# Patient Record
Sex: Male | Born: 1955
Health system: Southern US, Community
[De-identification: ages and names within clinical notes are randomized; demographics above are authoritative.]

## PROBLEM LIST (undated history)

## (undated) ENCOUNTER — Ambulatory Visit: Discharge status: Absent without leave | Payer: Self-pay

## (undated) DIAGNOSIS — B019 Varicella without complication: Secondary | ICD-10-CM

## (undated) DIAGNOSIS — I509 Heart failure, unspecified: Secondary | ICD-10-CM

## (undated) DIAGNOSIS — I2510 Atherosclerotic heart disease of native coronary artery without angina pectoris: Secondary | ICD-10-CM

## (undated) DIAGNOSIS — M1990 Unspecified osteoarthritis, unspecified site: Secondary | ICD-10-CM

## (undated) DIAGNOSIS — L80 Vitiligo: Secondary | ICD-10-CM

## (undated) DIAGNOSIS — I429 Cardiomyopathy, unspecified: Secondary | ICD-10-CM

## (undated) DIAGNOSIS — E785 Hyperlipidemia, unspecified: Secondary | ICD-10-CM

## (undated) DIAGNOSIS — I10 Essential (primary) hypertension: Secondary | ICD-10-CM

## (undated) HISTORY — DX: Heart failure, unspecified: I50.9

## (undated) HISTORY — DX: Hyperlipidemia, unspecified: E78.5

## (undated) HISTORY — PX: NO PAST SURGERIES: SHX2092

## (undated) HISTORY — DX: Varicella without complication: B01.9

## (undated) HISTORY — DX: Atherosclerotic heart disease of native coronary artery without angina pectoris: I25.10

## (undated) HISTORY — PX: POLYPECTOMY: SHX149

## (undated) HISTORY — DX: Cardiomyopathy, unspecified: I42.9

## (undated) HISTORY — DX: Essential (primary) hypertension: I10

## (undated) HISTORY — DX: Vitiligo: L80

---

## 2011-04-09 LAB — PSA: PSA: 0.8

## 2012-01-11 HISTORY — PX: COLONOSCOPY: SHX174

## 2014-12-11 LAB — HEPATIC FUNCTION PANEL
ALT: 19 U/L (ref 10–40)
AST: 19 U/L (ref 14–40)
Alkaline Phosphatase: 90 U/L (ref 25–125)
BILIRUBIN, TOTAL: 1 mg/dL

## 2014-12-11 LAB — BASIC METABOLIC PANEL
BUN: 10 mg/dL (ref 4–21)
Creatinine: 0.9 mg/dL (ref 0.6–1.3)
GLUCOSE: 92 mg/dL
POTASSIUM: 4.5 mmol/L (ref 3.4–5.3)
SODIUM: 137 mmol/L (ref 137–147)

## 2015-08-17 ENCOUNTER — Encounter: Payer: Self-pay | Admitting: Family Medicine

## 2015-08-17 ENCOUNTER — Ambulatory Visit (INDEPENDENT_AMBULATORY_CARE_PROVIDER_SITE_OTHER): Payer: BLUE CROSS/BLUE SHIELD | Admitting: Family Medicine

## 2015-08-17 DIAGNOSIS — Z0001 Encounter for general adult medical examination with abnormal findings: Secondary | ICD-10-CM | POA: Diagnosis not present

## 2015-08-17 DIAGNOSIS — R350 Frequency of micturition: Secondary | ICD-10-CM | POA: Diagnosis not present

## 2015-08-17 DIAGNOSIS — Z130 Encounter for screening for diseases of the blood and blood-forming organs and certain disorders involving the immune mechanism: Secondary | ICD-10-CM

## 2015-08-17 DIAGNOSIS — Z13220 Encounter for screening for lipoid disorders: Secondary | ICD-10-CM

## 2015-08-17 DIAGNOSIS — I10 Essential (primary) hypertension: Secondary | ICD-10-CM | POA: Diagnosis not present

## 2015-08-17 DIAGNOSIS — Z125 Encounter for screening for malignant neoplasm of prostate: Secondary | ICD-10-CM | POA: Diagnosis not present

## 2015-08-17 LAB — LIPID PANEL
CHOL/HDL RATIO: 4
Cholesterol: 208 mg/dL — ABNORMAL HIGH (ref 0–200)
HDL: 56.4 mg/dL (ref >39.00)
LDL CALC: 137 mg/dL — AB (ref 0–99)
NONHDL: 151.82
Triglycerides: 75 mg/dL (ref 0.0–149.0)
VLDL: 15 mg/dL (ref 0.0–40.0)

## 2015-08-17 LAB — COMPREHENSIVE METABOLIC PANEL
ALT: 11 U/L (ref 0–53)
AST: 12 U/L (ref 0–37)
Albumin: 4.4 g/dL (ref 3.5–5.2)
Alkaline Phosphatase: 89 U/L (ref 39–117)
BILIRUBIN TOTAL: 1 mg/dL (ref 0.2–1.2)
BUN: 14 mg/dL (ref 6–23)
CALCIUM: 9.7 mg/dL (ref 8.4–10.5)
CHLORIDE: 103 meq/L (ref 96–112)
CO2: 30 meq/L (ref 19–32)
CREATININE: 0.91 mg/dL (ref 0.40–1.50)
GFR: 109.69 mL/min (ref >60.00)
GLUCOSE: 114 mg/dL — AB (ref 70–99)
Potassium: 4.2 mEq/L (ref 3.5–5.1)
SODIUM: 139 meq/L (ref 135–145)
Total Protein: 7.5 g/dL (ref 6.0–8.3)

## 2015-08-17 LAB — POCT URINALYSIS DIPSTICK
BILIRUBIN UA: NEGATIVE
Glucose, UA: NEGATIVE
KETONES UA: NEGATIVE
LEUKOCYTES UA: NEGATIVE
Nitrite, UA: NEGATIVE
PH UA: 6
PROTEIN UA: NEGATIVE
SPEC GRAV UA: 1.01
Urobilinogen, UA: 0.2

## 2015-08-17 LAB — CBC
HCT: 43.5 % (ref 39.0–52.0)
Hemoglobin: 14.3 g/dL (ref 13.0–17.0)
MCHC: 32.9 g/dL (ref 30.0–36.0)
MCV: 91.4 fl (ref 78.0–100.0)
PLATELETS: 229 10*3/uL (ref 150.0–400.0)
RBC: 4.76 Mil/uL (ref 4.22–5.81)
RDW: 13.9 % (ref 11.5–15.5)
WBC: 4.6 10*3/uL (ref 4.0–10.5)

## 2015-08-17 LAB — PSA: PSA: 1.25 ng/mL (ref 0.10–4.00)

## 2015-08-17 LAB — HEMOGLOBIN A1C: Hgb A1c MFr Bld: 5.6 % (ref 4.6–6.5)

## 2015-08-17 NOTE — Assessment & Plan Note (Signed)
Screening labs today. Colonoscopy up-to-date. Tetanus up-to-date. No indication for zostavax or pneumonia vaccine at this time.

## 2015-08-17 NOTE — Progress Notes (Signed)
Pre visit review using our clinic review tool, if applicable. No additional management support is needed unless otherwise documented below in the visit note. 

## 2015-08-17 NOTE — Patient Instructions (Signed)
Check your BP at home and keep a log.  Follow up BP check in 2 weeks.  We will call with your lab results.  Take care  Dr. Adriana Norman   Health Maintenance, Male A healthy lifestyle and preventative care can promote health and wellness.  Maintain regular health, dental, and eye exams.  Eat a healthy diet. Foods like vegetables, fruits, whole grains, low-fat dairy products, and lean protein foods contain the nutrients you need and are low in calories. Decrease your intake of foods high in solid fats, added sugars, and salt. Get information about a proper diet from your health care provider, if necessary.  Regular physical exercise is one of the most important things you can do for your health. Most adults should get at least 150 minutes of moderate-intensity exercise (any activity that increases your heart rate and causes you to sweat) each week. In addition, most adults need muscle-strengthening exercises on 2 or more days a week.   Maintain a healthy weight. The body mass index (BMI) is a screening tool to identify possible weight problems. It provides an estimate of body fat based on height and weight. Your health care provider can find your BMI and can help you achieve or maintain a healthy weight. For males 20 years and older:  A BMI below 18.5 is considered underweight.  A BMI of 18.5 to 24.9 is normal.  A BMI of 25 to 29.9 is considered overweight.  A BMI of 30 and above is considered obese.  Maintain normal blood lipids and cholesterol by exercising and minimizing your intake of saturated fat. Eat a balanced diet with plenty of fruits and vegetables. Blood tests for lipids and cholesterol should begin at age 3 and be repeated every 5 years. If your lipid or cholesterol levels are high, you are over age 71, or you are at high risk for heart disease, you may need your cholesterol levels checked more frequently.Ongoing high lipid and cholesterol levels should be treated with medicines if  diet and exercise are not working.  If you smoke, find out from your health care provider how to quit. If you do not use tobacco, do not start.  Lung cancer screening is recommended for adults aged 55-80 years who are at high risk for developing lung cancer because of a history of smoking. A yearly low-dose CT scan of the lungs is recommended for people who have at least a 30-pack-year history of smoking and are current smokers or have quit within the past 15 years. A pack year of smoking is smoking an average of 1 pack of cigarettes a day for 1 year (for example, a 30-pack-year history of smoking could mean smoking 1 pack a day for 30 years or 2 packs a day for 15 years). Yearly screening should continue until the smoker has stopped smoking for at least 15 years. Yearly screening should be stopped for people who develop a health problem that would prevent them from having lung cancer treatment.  If you choose to drink alcohol, do not have more than 2 drinks per day. One drink is considered to be 12 oz (360 mL) of beer, 5 oz (150 mL) of wine, or 1.5 oz (45 mL) of liquor.  Avoid the use of street drugs. Do not share needles with anyone. Ask for help if you need support or instructions about stopping the use of drugs.  High blood pressure causes heart disease and increases the risk of stroke. High blood pressure is more likely  to develop in:  People who have blood pressure in the end of the normal range (100-139/85-89 mm Hg).  People who are overweight or obese.  People who are African American.  If you are 61-3 years of age, have your blood pressure checked every 3-5 years. If you are 88 years of age or older, have your blood pressure checked every year. You should have your blood pressure measured twice--once when you are at a hospital or clinic, and once when you are not at a hospital or clinic. Record the average of the two measurements. To check your blood pressure when you are not at a  hospital or clinic, you can use:  An automated blood pressure machine at a pharmacy.  A home blood pressure monitor.  If you are 2-31 years old, ask your health care provider if you should take aspirin to prevent heart disease.  Diabetes screening involves taking a blood sample to check your fasting blood sugar level. This should be done once every 3 years after age 90 if you are at a normal weight and without risk factors for diabetes. Testing should be considered at a younger age or be carried out more frequently if you are overweight and have at least 1 risk factor for diabetes.  Colorectal cancer can be detected and often prevented. Most routine colorectal cancer screening begins at the age of 50 and continues through age 15. However, your health care provider may recommend screening at an earlier age if you have risk factors for colon cancer. On a yearly basis, your health care provider may provide home test kits to check for hidden blood in the stool. A small camera at the end of a tube may be used to directly examine the colon (sigmoidoscopy or colonoscopy) to detect the earliest forms of colorectal cancer. Talk to your health care provider about this at age 32 when routine screening begins. A direct exam of the colon should be repeated every 5-10 years through age 49, unless early forms of precancerous polyps or small growths are found.  People who are at an increased risk for hepatitis B should be screened for this virus. You are considered at high risk for hepatitis B if:  You were born in a country where hepatitis B occurs often. Talk with your health care provider about which countries are considered high risk.  Your parents were born in a high-risk country and you have not received a shot to protect against hepatitis B (hepatitis B vaccine).  You have HIV or AIDS.  You use needles to inject street drugs.  You live with, or have sex with, someone who has hepatitis B.  You are a  man who has sex with other men (MSM).  You get hemodialysis treatment.  You take certain medicines for conditions like cancer, organ transplantation, and autoimmune conditions.  Hepatitis C blood testing is recommended for all people born from [birth year] through 1964 and any individual with known risk factors for hepatitis C.  Healthy men should no longer receive prostate-specific antigen (PSA) blood tests as part of routine cancer screening. Talk to your health care provider about prostate cancer screening.  Testicular cancer screening is not recommended for adolescents or adult males who have no symptoms. Screening includes self-exam, a health care provider exam, and other screening tests. Consult with your health care provider about any symptoms you have or any concerns you have about testicular cancer.  Practice safe sex. Use condoms and avoid high-risk sexual practices to  reduce the spread of sexually transmitted infections (STIs).  You should be screened for STIs, including gonorrhea and chlamydia if:  You are sexually active and are younger than 24 years.  You are older than 24 years, and your health care provider tells you that you are at risk for this type of infection.  Your sexual activity has changed since you were last screened, and you are at an increased risk for chlamydia or gonorrhea. Ask your health care provider if you are at risk.  If you are at risk of being infected with HIV, it is recommended that you take a prescription medicine daily to prevent HIV infection. This is called pre-exposure prophylaxis (PrEP). You are considered at risk if:  You are a man who has sex with other men (MSM).  You are a heterosexual man who is sexually active with multiple partners.  You take drugs by injection.  You are sexually active with a partner who has HIV.  Talk with your health care provider about whether you are at high risk of being infected with HIV. If you choose to begin PrEP,  you should first be tested for HIV. You should then be tested every 3 months for as long as you are taking PrEP.  Use sunscreen. Apply sunscreen liberally and repeatedly throughout the day. You should seek shade when your shadow is shorter than you. Protect yourself by wearing long sleeves, pants, a wide-brimmed hat, and sunglasses year round whenever you are outdoors.  Tell your health care provider of new moles or changes in moles, especially if there is a change in shape or color. Also, tell your health care provider if a mole is larger than the size of a pencil eraser.  A one-time screening for abdominal aortic aneurysm (AAA) and surgical repair of large AAAs by ultrasound is recommended for men aged 65-75 years who are current or former smokers.  Stay current with your vaccines (immunizations).   This information is not intended to replace advice given to you by your health care provider. Make sure you discuss any questions you have with your health care provider.   Document Released: 06/25/2007 Document Revised: 01/17/2014 Document Reviewed: 05/24/2010 Elsevier Interactive Patient Education [employer]

## 2015-08-17 NOTE — Assessment & Plan Note (Addendum)
Uncontrolled. BP elevated today and will repeat. Patient endorses that he does not normally have any issues with his blood pressure. Labs today. Patient to check his blood pressure daily at home. Follow-up in 2 weeks for blood pressure check. Patient is to inform me if his blood pressure continues to stay elevated greater than 140/90 consistently. Continuing Norvasc.

## 2015-08-17 NOTE — Assessment & Plan Note (Signed)
New problem. Likely related to increased water intake particularly close to bedtime. Patient could have an element of BPH. Advised conservative measures at this point: Decreasing caffeine and water intake prior to bed. Follow-up if he fails to improve or worsens.

## 2015-08-17 NOTE — Progress Notes (Signed)
Subjective:  Patient ID: Sean Norman, male    DOB: Sep 30, 1955  Age: 60 y.o. MRN: [id number]  CC: Establish care  HPI Sean Norman is a 60 y.o. male presents to the clinic today to establish care.  Preventative Healthcare  Colonoscopy: Up to date - 06/30/14  Immunizations  Tetanus - 1-2 years ago.  Pneumococcal - N/A given age and lack of co-morbidities/risk factors.  Flu - We do not have the vaccine yet  Zoster - Not indicated per guidelines.  Prostate cancer screening: PSA today.  Hepatitis C screening - Declines.  Labs: Screening labs today.  Exercise: Regular exercise.   Alcohol use: No.  Smoking/tobacco use: No.  STD/HIV testing: Declines.  Wears seat belt: Yes.   Hypertension  Has been stable Per his report.  He's taking amlodipine 5 mg twice daily. He endorses compliance.  His blood pressure is markedly elevated today. See below.   We'll discuss today.  Urinary frequency  Patient reports that he's been urinating frequently for quite some time.  He states that he's getting up 4-5 times a night to urinate.  Patient does state he drinks a lot of water.  He reports a good stream and flow.  No hesitancy. No drinking.  No known relieving factors. No associated dysuria.  He reports intentional weight loss.  No reports of abdominal pain, fever, chills. No other complaints this time.  PMH, Surgical Hx, Family Hx, Social History reviewed and updated as below.  Past Medical History:  Diagnosis Date  . Chicken pox   . Hypertension    Past Surgical History:  Procedure Laterality Date  . NO PAST SURGERIES     Family History  Problem Relation Age of Onset  . Heart disease Mother   . Hypertension Mother   . Kidney disease Mother   . Diabetes Mother   . Heart disease Father   . Stroke Father   . Hypertension Father   . Breast cancer Maternal Grandmother   . Hypertension Maternal Grandmother   . Heart disease Maternal Grandmother   .  Hypertension Maternal Grandfather   . Stroke Maternal Grandfather   . Heart disease Maternal Grandfather   . Diabetes Paternal Grandfather    Social History  Substance Use Topics  . Smoking status: Never Smoker  . Smokeless tobacco: Never Used  . Alcohol use No    Review of Systems  Genitourinary: Positive for frequency.  All other systems reviewed and are negative.  Objective:   Today's Vitals: BP (!) 168/114 (BP Location: Left Arm, Patient Position: Sitting, Cuff Size: Normal)   Pulse 87   Temp 98 F (36.7 C) (Oral)   Ht '6\' 2"'  (1.88 m)   Wt 264 lb 4 oz (119.9 kg)   SpO2 98%   BMI 33.93 kg/m   Physical Exam  Constitutional: He is oriented to person, place, and time. He appears well-developed and well-nourished. No distress.  HENT:  Head: Normocephalic and atraumatic.  Nose: Nose normal.  Mouth/Throat: Oropharynx is clear and moist. No oropharyngeal exudate.  Normal TM's bilaterally.   Eyes: Conjunctivae are normal. No scleral icterus.  Neck: Neck supple. No thyromegaly present.  Cardiovascular: Normal rate and regular rhythm.   No murmur heard. Pulmonary/Chest: Effort normal and breath sounds normal. He has no wheezes. He has no rales.  Abdominal: Soft. He exhibits no distension. There is no tenderness. There is no rebound and no guarding.  Musculoskeletal: Normal range of motion. He exhibits no edema.  Lymphadenopathy:  He has no cervical adenopathy.  Neurological: He is alert and oriented to person, place, and time.  Skin: Skin is warm and dry. No rash noted.  No hair (assuming from alopecia).  Psychiatric: He has a normal mood and affect.  Vitals reviewed.  Assessment & Plan:   Problem List Items Addressed This Visit    Encounter for preventative adult health care exam with abnormal findings    Screening labs today. Colonoscopy up-to-date. Tetanus up-to-date. No indication for zostavax or pneumonia vaccine at this time.      Essential hypertension     Uncontrolled. BP elevated today and will repeat. Patient endorses that he does not normally have any issues with his blood pressure. Labs today. Patient to check his blood pressure daily at home. Follow-up in 2 weeks for blood pressure check. Patient is to inform me if his blood pressure continues to stay elevated greater than 140/90 consistently. Continuing Norvasc.      Relevant Medications   amLODipine (NORVASC) 5 MG tablet   Other Relevant Orders   Comp Met (CMET)   HgB A1c   Urinary frequency    New problem. Likely related to increased water intake particularly close to bedtime. Patient could have an element of BPH. Advised conservative measures at this point: Decreasing caffeine and water intake prior to bed. Follow-up if he fails to improve or worsens.      Relevant Orders   POCT Urinalysis Dipstick (Completed)    Other Visit Diagnoses    Screening for deficiency anemia       Relevant Orders   CBC   Screening for prostate cancer       Relevant Orders   PSA   Screening, lipid       Relevant Orders   Lipid Profile      Outpatient Encounter Prescriptions as of 08/17/2015  Medication Sig  . amLODipine (NORVASC) 5 MG tablet Take 5 mg by mouth 2 (two) times daily.   No facility-administered encounter medications on file as of 08/17/2015.     Follow-up: 2 weeks for BP check.   [place]enzie

## 2015-08-19 ENCOUNTER — Other Ambulatory Visit: Payer: Self-pay | Admitting: Family Medicine

## 2015-08-19 MED ORDER — ATORVASTATIN CALCIUM 40 MG PO TABS: 40.0000 mg | ORAL_TABLET | Freq: Every day | ORAL | 3 refills | Status: DC

## 2015-09-10 ENCOUNTER — Encounter: Payer: Self-pay | Admitting: Family Medicine

## 2015-09-15 ENCOUNTER — Telehealth: Payer: Self-pay | Admitting: *Deleted

## 2015-09-16 ENCOUNTER — Encounter: Payer: Self-pay | Admitting: Family Medicine

## 2015-09-16 ENCOUNTER — Encounter (INDEPENDENT_AMBULATORY_CARE_PROVIDER_SITE_OTHER): Payer: Self-pay

## 2015-09-16 ENCOUNTER — Ambulatory Visit (INDEPENDENT_AMBULATORY_CARE_PROVIDER_SITE_OTHER): Payer: BLUE CROSS/BLUE SHIELD | Admitting: Family Medicine

## 2015-09-16 VITALS — BP 164/107 | HR 86 | Temp 97.8°F | Wt 263.5 lb

## 2015-09-16 DIAGNOSIS — R8290 Unspecified abnormal findings in urine: Secondary | ICD-10-CM | POA: Diagnosis not present

## 2015-09-16 DIAGNOSIS — I10 Essential (primary) hypertension: Secondary | ICD-10-CM

## 2015-09-16 DIAGNOSIS — E785 Hyperlipidemia, unspecified: Secondary | ICD-10-CM | POA: Insufficient documentation

## 2015-09-16 MED ORDER — HYDROCHLOROTHIAZIDE 25 MG PO TABS: 25.0000 mg | ORAL_TABLET | Freq: Every day | ORAL | 3 refills | Status: DC

## 2015-09-16 NOTE — Progress Notes (Signed)
   Subjective:  Patient ID: Sean Norman, male    DOB: 01/07/1956  Age: 60 y.o. MRN: [id number]  CC: Followup HTN  HPI:  60 year old male with hypertension and hyperlipidemia presents for follow-up regarding hypertension.  HTN  BP readings elevated at home.  BP elevated today.  Endorses compliance with Norvasc.  No other associated symptoms.  No other complaints at this time.  Social Hx   Social History   Social History  . Marital status: Married    Spouse name: N/A  . Number of children: N/A  . Years of education: N/A   Social History Main Topics  . Smoking status: Never Smoker  . Smokeless tobacco: Never Used  . Alcohol use No  . Drug use: No  . Sexual activity: Not Asked   Other Topics Concern  . None   Social History Narrative  . None   Review of Systems  Respiratory: Negative.   Cardiovascular: Negative.    Objective:  BP (!) 164/107 (BP Location: Right Arm, Patient Position: Sitting, Cuff Size: Large)   Pulse 86   Temp 97.8 F (36.6 C) (Oral)   Wt 263 lb 8 oz (119.5 kg)   SpO2 96%   BMI 33.83 kg/m   BP/Weight 09/16/2015 08/17/2015  Systolic BP 164 168  Diastolic BP 107 114  Wt. (Lbs) 263.5 264.25  BMI 33.83 33.93   Physical Exam  Constitutional: He is oriented to person, place, and time. He appears well-developed. No distress.  Cardiovascular: Normal rate and regular rhythm.   Pulmonary/Chest: Effort normal. He has no wheezes. He has no rales.  Neurological: He is alert and oriented to person, place, and time.  Psychiatric: He has a normal mood and affect.  Vitals reviewed.  Lab Results  Component Value Date   WBC 4.6 08/17/2015   HGB 14.3 08/17/2015   HCT 43.5 08/17/2015   PLT 229.0 08/17/2015   GLUCOSE 114 (H) 08/17/2015   CHOL 208 (H) 08/17/2015   TRIG 75.0 08/17/2015   HDL 56.40 08/17/2015   LDLCALC 137 (H) 08/17/2015   ALT 11 08/17/2015   AST 12 08/17/2015   NA 139 08/17/2015   K 4.2 08/17/2015   CL 103 08/17/2015   CREATININE 0.91 08/17/2015   BUN 14 08/17/2015   CO2 30 08/17/2015   PSA 1.25 08/17/2015   HGBA1C 5.6 08/17/2015    Assessment & Plan:   Problem List Items Addressed This Visit    Essential hypertension - Primary    Established problem, worsening. Continue Lisinopril. Adding HCTZ.       Relevant Medications   hydrochlorothiazide (HYDRODIURIL) 25 MG tablet    Other Visit Diagnoses    Abnormal urinalysis       Relevant Orders   Urine Microscopic Only     Meds ordered this encounter  Medications  . hydrochlorothiazide (HYDRODIURIL) 25 MG tablet    Sig: Take 1 tablet (25 mg total) by mouth daily.    Dispense:  90 tablet    Refill:  3    Follow-up: Return in about 2 weeks (around 09/30/2015) for BP check - Nurse visit.  Everlene Other DO [facility]

## 2015-09-16 NOTE — Progress Notes (Signed)
Pre visit review using our clinic review tool, if applicable. No additional management support is needed unless otherwise documented below in the visit note. 

## 2015-09-16 NOTE — Patient Instructions (Signed)
Take the medication daily in ADDITION to the Amlodipine.  Call with your BP readings in 2 weeks.  Take care  Dr. Adriana Simas

## 2015-09-17 LAB — URINALYSIS, MICROSCOPIC ONLY
RBC / HPF: NONE SEEN (ref 0–?)
WBC, UA: NONE SEEN (ref 0–?)

## 2015-09-17 NOTE — Assessment & Plan Note (Signed)
Established problem, worsening. Continue Lisinopril. Adding HCTZ.

## 2016-09-18 ENCOUNTER — Other Ambulatory Visit: Payer: Self-pay | Admitting: Family Medicine

## 2017-09-01 ENCOUNTER — Other Ambulatory Visit: Payer: Self-pay | Admitting: Family Medicine

## 2017-12-01 ENCOUNTER — Ambulatory Visit (INDEPENDENT_AMBULATORY_CARE_PROVIDER_SITE_OTHER): Payer: 59 | Admitting: Family Medicine

## 2017-12-01 ENCOUNTER — Encounter: Payer: Self-pay | Admitting: Family Medicine

## 2017-12-01 VITALS — BP 138/98 | HR 94 | Temp 97.8°F | Ht 74.5 in | Wt 303.0 lb

## 2017-12-01 DIAGNOSIS — L80 Vitiligo: Secondary | ICD-10-CM

## 2017-12-01 DIAGNOSIS — E785 Hyperlipidemia, unspecified: Secondary | ICD-10-CM | POA: Diagnosis not present

## 2017-12-01 DIAGNOSIS — I10 Essential (primary) hypertension: Secondary | ICD-10-CM | POA: Diagnosis not present

## 2017-12-01 DIAGNOSIS — Z125 Encounter for screening for malignant neoplasm of prostate: Secondary | ICD-10-CM

## 2017-12-01 LAB — CBC
HEMATOCRIT: 42.8 % (ref 39.0–52.0)
HEMOGLOBIN: 14.1 g/dL (ref 13.0–17.0)
MCHC: 33 g/dL (ref 30.0–36.0)
MCV: 91.6 fl (ref 78.0–100.0)
PLATELETS: 276 10*3/uL (ref 150.0–400.0)
RBC: 4.67 Mil/uL (ref 4.22–5.81)
RDW: 13.2 % (ref 11.5–15.5)
WBC: 6.4 10*3/uL (ref 4.0–10.5)

## 2017-12-01 MED ORDER — HYDROCHLOROTHIAZIDE 25 MG PO TABS: 25.0000 mg | ORAL_TABLET | Freq: Every day | ORAL | 1 refills | Status: DC

## 2017-12-01 NOTE — Progress Notes (Signed)
Subjective:    Patient ID: Sean Norman, male    DOB: [birth year], 62 y.o.   MRN: [id number]  HPI   Presents to clinic as a TOC from Dr Adriana Simas, last seen here in 2017  He complains of spots on hand that began in September, states he just started noticing the backs of his hands lightening up in patchy-like pattern, also has a white spot on his abdomen and noticed one also on the tip of nose.   Patient also requires refill blood pressure medication.  He has been taking hydrochlorothiazide 25 mg once per day for BP control.  He had been on amlodipine as well in the past, but does not take that currently.  Patient was on atorvastatin previously for cholesterol control, but is no longer on that either.  Patient Active Problem List   Diagnosis Date Noted  . Hyperlipidemia 09/16/2015  . Essential hypertension 08/17/2015   Social History   Tobacco Use  . Smoking status: Never Smoker  . Smokeless tobacco: Never Used  Substance Use Topics  . Alcohol use: No   Review of Systems  Constitutional: Negative for chills, fatigue and fever.  HENT: Negative for congestion, ear pain, sinus pain and sore throat.   Eyes: Negative.   Respiratory: Negative for cough, shortness of breath and wheezing.   Cardiovascular: Negative for chest pain, palpitations and leg swelling.  Gastrointestinal: Negative for abdominal pain, diarrhea, nausea and vomiting.  Genitourinary: Negative for dysuria, frequency and urgency.  Musculoskeletal: Negative for arthralgias and myalgias.  Skin: +white patches on skin  Neurological: Negative for syncope, light-headedness and headaches.  Psychiatric/Behavioral: The patient is not nervous/anxious.       Objective:   Physical Exam  Constitutional:  She appears well-developed and well-nourished. No distress.  HENT:  Head: Normocephalic and atraumatic.  Eyes: Pupils are equal, round, and reactive to light. EOM are normal. No scleral icterus.  Neck: Normal range of  motion. Neck supple. No tracheal deviation present.  Cardiovascular: Normal rate, regular rhythm and normal heart sounds.  Pulmonary/Chest: Effort normal and breath sounds normal. No respiratory distress. She has no wheezes. She has no rales.  Abdominal: Soft. Bowel sounds are normal. There is no tenderness.  Neurological: She is alert and oriented to person, place, and time.  Gait normal  Skin: Skin is warm and dry.  Patient is African-American.  Small scattered white patches seen on the backs of both hands, palms of hands appear mostly white.  Small white patch seen on tip of nose, and one small white patch seen on abdomen along abdominal fold.  No rash seen.  No scabbing, no vesicles, no dry flaky patches of skin. Psychiatric: She has a normal mood and affect. Her behavior is normal. Thought content normal.   Nursing note and vitals reviewed.  Vitals:   12/01/17 1336  BP: (!) 138/98  Pulse: 94  Temp: 97.8 F (36.6 C)  SpO2: 97%    Assessment & Plan:    Essential hypertension-patient given refill on hydrochlorothiazide 25 mg.  We will continue to monitor blood pressure readings with follow-up visit in 2 weeks.  Advised patient that if his diastolic blood pressure continues to remain elevated we may have to add amlodipine back onto his medication regimen.  Hyperlipidemia- patient had been on atorvastatin previously, we will recheck lipid panel to see where his numbers are running and add atorvastatin back on if necessary.  Prostate cancer screening-we will include PSA level and patient's lab work today.  Vitiligo-patient's skin lightening appearance consistent with vitiligo.  Patient given informational handout from up-to-date about vitiligo.  Discussed different options that we could try if he is interested in such as using a steroid cream on light and skin areas, but patient declines at this time saying the spots do not bother him much he just want to be sure nothing else serious is  going on.  Follow-up here in 2 weeks for recheck of blood pressure and to see if any additional BP medications are needed.

## 2017-12-02 LAB — COMPREHENSIVE METABOLIC PANEL
AG Ratio: 1.5 (calc) (ref 1.0–2.5)
ALT: 15 U/L (ref 9–46)
AST: 16 U/L (ref 10–35)
Albumin: 4.1 g/dL (ref 3.6–5.1)
Alkaline phosphatase (APISO): 87 U/L (ref 40–115)
BUN: 14 mg/dL (ref 7–25)
CO2: 28 mmol/L (ref 20–32)
CREATININE: 1.04 mg/dL (ref 0.70–1.25)
Calcium: 9.3 mg/dL (ref 8.6–10.3)
Chloride: 102 mmol/L (ref 98–110)
GLUCOSE: 94 mg/dL (ref 65–99)
Globulin: 2.8 g/dL (calc) (ref 1.9–3.7)
POTASSIUM: 4.5 mmol/L (ref 3.5–5.3)
SODIUM: 141 mmol/L (ref 135–146)
TOTAL PROTEIN: 6.9 g/dL (ref 6.1–8.1)
Total Bilirubin: 0.4 mg/dL (ref 0.2–1.2)

## 2017-12-02 LAB — TSH: TSH: 2.45 mIU/L (ref 0.40–4.50)

## 2017-12-02 LAB — LIPID PANEL
CHOLESTEROL: 200 mg/dL — AB (ref <200)
HDL: 46 mg/dL (ref >40)
LDL CHOLESTEROL (CALC): 139 mg/dL — AB
Non-HDL Cholesterol (Calc): 154 mg/dL (calc) — ABNORMAL HIGH (ref <130)
Total CHOL/HDL Ratio: 4.3 (calc) (ref <5.0)
Triglycerides: 63 mg/dL (ref <150)

## 2017-12-04 LAB — PSA, TOTAL AND FREE
PSA, % Free: 31 % (calc) (ref >25)
PSA, FREE: 0.4 ng/mL
PSA, Total: 1.3 ng/mL (ref <=4.0)

## 2017-12-05 ENCOUNTER — Ambulatory Visit: Payer: Self-pay

## 2017-12-05 NOTE — Telephone Encounter (Signed)
Patient called and he says that Lauren was supposed to add his amlodipine. I advised of the OV note that amlodipine may need to be added, but the provider ordered HCTZ for now and advised to follow up in 2 weeks, patient verbalized understanding. Appointment scheduled for Friday, 12/15/17 at 1020 with Leanora Cover, FNP.  Reason for Disposition . [1] Other NON-URGENT information for PCP AND [2] does not require PCP response  Protocols used: PCP CALL - NO TRIAGE-A-AH

## 2017-12-05 NOTE — Telephone Encounter (Signed)
Patient called, left VM to return call to the office to discuss medication. Patient says the BP medication to lower the bottom number wasn't called into the pharmacy. [facility] called and advised the patient picked up HCTZ 25 mg on 12/01/17.

## 2017-12-05 NOTE — Telephone Encounter (Deleted)
  Reason for Disposition . [1] Other NON-URGENT information for PCP AND [2] does not require PCP response  Protocols used: PCP CALL - NO TRIAGE-A-AH  

## 2017-12-06 NOTE — Telephone Encounter (Signed)
FYI

## 2017-12-15 ENCOUNTER — Encounter: Payer: Self-pay | Admitting: Family Medicine

## 2017-12-15 ENCOUNTER — Ambulatory Visit: Payer: 59 | Admitting: Family Medicine

## 2017-12-15 ENCOUNTER — Ambulatory Visit (INDEPENDENT_AMBULATORY_CARE_PROVIDER_SITE_OTHER): Payer: 59 | Admitting: Family Medicine

## 2017-12-15 VITALS — BP 132/88 | HR 88 | Temp 98.1°F | Ht 76.0 in | Wt 301.4 lb

## 2017-12-15 DIAGNOSIS — I10 Essential (primary) hypertension: Secondary | ICD-10-CM

## 2017-12-15 DIAGNOSIS — E785 Hyperlipidemia, unspecified: Secondary | ICD-10-CM

## 2017-12-15 DIAGNOSIS — L80 Vitiligo: Secondary | ICD-10-CM

## 2017-12-15 NOTE — Progress Notes (Signed)
Subjective:    Patient ID: Sean Norman, male    DOB: 03-21-1955, 62 y.o.   MRN: [id number]  HPI  Presents to clinic for 2 week follow up on BP after starting back on his HCTZ and also to discuss lipids.  Patient has been tolerating hydrochlorothiazide without any issues.  Blood pressures have improved since getting back on medication.  Patient denies any chest pain, shortness of breath, lower extremity swelling or palpitations.  Patient had taken atorvastatin in the past for cholesterol control, but would prefer not to get back on it at this time.  Patient is worried he will develop the muscle aches that often go along with statin use, and would like to work on diet and exercise first before adding on statin medication. Lipid Panel     Component Value Date/Time   CHOL 200 (H) 12/01/2017 1401   TRIG 63 12/01/2017 1401   HDL 46 12/01/2017 1401   CHOLHDL 4.3 12/01/2017 1401   VLDL 15.0 08/17/2015 1411   LDLCALC 139 (H) 12/01/2017 1401    Patient also is interested in seeing a dermatologist in regards to his vitiligo, feels like some of the patches on hands have gotten worse and also has noticed lightening of skin near eyebrows.   Patient Active Problem List   Diagnosis Date Noted  . Vitiligo 12/01/2017  . Hyperlipidemia 09/16/2015  . Essential hypertension 08/17/2015   Social History   Tobacco Use  . Smoking status: Never Smoker  . Smokeless tobacco: Never Used  Substance Use Topics  . Alcohol use: No    Review of Systems  Constitutional: Negative for chills, fatigue and fever.  HENT: Negative for congestion, ear pain, sinus pain and sore throat.   Eyes: Negative.   Respiratory: Negative for cough, shortness of breath and wheezing.   Cardiovascular: Negative for chest pain, palpitations and leg swelling.  Gastrointestinal: Negative for abdominal pain, diarrhea, nausea and vomiting.  Genitourinary: Negative for dysuria, frequency and urgency.  Musculoskeletal: Negative  for arthralgias and myalgias.  Skin: +vitiligo  Neurological: Negative for syncope, light-headedness and headaches.  Psychiatric/Behavioral: The patient is not nervous/anxious.       Objective:   Physical Exam  Constitutional: He is oriented to person, place, and time. No distress.  HENT:  Head: Normocephalic and atraumatic.  Eyes: Conjunctivae and EOM are normal. No scleral icterus.  Neck: Neck supple. No tracheal deviation present.  Cardiovascular: Normal rate and regular rhythm.  No murmur heard. No carotid bruit  Musculoskeletal: He exhibits no edema.  Neurological: He is alert and oriented to person, place, and time. No cranial nerve deficit.  Skin: Skin is warm and dry. No pallor.  Small scattered white patches seen on the backs of both hands, palms of hands appear mostly white.  Small white patch seen on tip of nose, some lightening near eye brows and one small white patch seen on abdomen along abdominal fold.  Psychiatric: He has a normal mood and affect. His behavior is normal.  Nursing note and vitals reviewed.     Vitals:   12/15/17 1529  BP: 132/88  Pulse: 88  Temp: 98.1 F (36.7 C)  SpO2: 93%   BP Readings from Last 3 Encounters:  12/15/17 132/88  12/01/17 (!) 138/98  09/16/15 (!) 164/107    Assessment & Plan:   Essential hypertension -blood pressure well controlled since getting back on hydrochlorothiazide.  Patient will remain on hydrochlorothiazide at 25 mg dose.  Hyperlipidemia- discussed pros and cons of statin  medication.  Patient would prefer to do diet exercise at this time.  Advised to follow a high vegetable lean protein, low carb diet and do regular physical activity to promote weight loss and improvement in cholesterol numbers.  We will plan to recheck cholesterol levels at next visit about 3 months.  Advised patient that if his numbers continue to remain high then we will need to reconsider adding in a cholesterol medication.  Vitiligo- patient  given dermatology referral  Follow-up here in approximately 3 months for recheck on blood pressure and hyperlipidemia.  Patient is aware he will be contacted in regards to his dermatology referral.  Patient also aware that he can return to clinic anytime if issues arise.

## 2017-12-29 ENCOUNTER — Ambulatory Visit: Payer: 59 | Admitting: Family Medicine

## 2017-12-29 ENCOUNTER — Ambulatory Visit: Payer: 59 | Admitting: Family

## 2018-02-07 DIAGNOSIS — L80 Vitiligo: Secondary | ICD-10-CM | POA: Diagnosis not present

## 2018-02-07 DIAGNOSIS — L631 Alopecia universalis: Secondary | ICD-10-CM | POA: Diagnosis not present

## 2018-03-15 DIAGNOSIS — L631 Alopecia universalis: Secondary | ICD-10-CM | POA: Diagnosis not present

## 2018-03-15 DIAGNOSIS — L638 Other alopecia areata: Secondary | ICD-10-CM | POA: Diagnosis not present

## 2018-03-15 DIAGNOSIS — L80 Vitiligo: Secondary | ICD-10-CM | POA: Diagnosis not present

## 2018-03-16 ENCOUNTER — Ambulatory Visit: Payer: 59 | Admitting: Family Medicine

## 2018-06-21 ENCOUNTER — Ambulatory Visit: Payer: 59 | Admitting: Family Medicine

## 2018-07-05 ENCOUNTER — Other Ambulatory Visit: Payer: Self-pay

## 2018-07-06 ENCOUNTER — Encounter: Payer: Self-pay | Admitting: Family Medicine

## 2018-07-06 ENCOUNTER — Encounter (INDEPENDENT_AMBULATORY_CARE_PROVIDER_SITE_OTHER): Payer: Self-pay

## 2018-07-06 ENCOUNTER — Ambulatory Visit: Payer: 59 | Admitting: Family Medicine

## 2018-07-06 ENCOUNTER — Ambulatory Visit (INDEPENDENT_AMBULATORY_CARE_PROVIDER_SITE_OTHER): Payer: 59 | Admitting: Family Medicine

## 2018-07-06 ENCOUNTER — Other Ambulatory Visit: Payer: Self-pay

## 2018-07-06 DIAGNOSIS — I10 Essential (primary) hypertension: Secondary | ICD-10-CM

## 2018-07-06 MED ORDER — HYDROCHLOROTHIAZIDE 25 MG PO TABS: 25.0000 mg | ORAL_TABLET | Freq: Every day | ORAL | 3 refills | Status: DC

## 2018-07-06 MED ORDER — AMLODIPINE BESYLATE 2.5 MG PO TABS: 2.5000 mg | ORAL_TABLET | Freq: Every day | ORAL | 3 refills | Status: DC

## 2018-07-06 NOTE — Progress Notes (Signed)
Subjective:    Patient ID: Sean Norman, male    DOB: [birth year], 63 y.o.   MRN: [id number]  HPI  Patient presents to clinic for follow-up on his blood pressure and vitiligo.  He has been without his hydrochlorothiazide for about 3 days.  States when he is taking his hydrochlorothiazide his blood pressures usually run in the 150s over 90s at home.  States he tolerates hydrochlorothiazide without any problems.  Denies chest pain, shortness of breath or wheezing.  Denies any lower extremity swelling.  Patient has seen dermatology in regards to vitiligo.  He was prescribed topical creams, but dermatologist made him aware that there is not really much we can do for the vitiligo other than the topical treatments or laser therapy.  Patient would prefer not to have to do laser therapy and will just go with the topical treatments for now and states he has decided that if the vitiligo patches spread, then he can "live with that"  Patient Active Problem List   Diagnosis Date Noted  . Vitiligo 12/01/2017  . Hyperlipidemia 09/16/2015  . Essential hypertension 08/17/2015   Social History   Tobacco Use  . Smoking status: Never Smoker  . Smokeless tobacco: Never Used  Substance Use Topics  . Alcohol use: No   Review of Systems  Constitutional: Negative for chills, fatigue and fever.  HENT: Negative for congestion, ear pain, sinus pain and sore throat.   Eyes: Negative.   Respiratory: Negative for cough, shortness of breath and wheezing.   Cardiovascular: Negative for chest pain, palpitations and leg swelling.  Gastrointestinal: Negative for abdominal pain, diarrhea, nausea and vomiting.  Genitourinary: Negative for dysuria, frequency and urgency.  Musculoskeletal: Negative for arthralgias and myalgias.  Skin: Negative for color change, pallor and rash.  Neurological: Negative for syncope, light-headedness and headaches.  Psychiatric/Behavioral: The patient is not nervous/anxious.        Objective:   Physical Exam Vitals signs and nursing note reviewed.  Constitutional:      General: He is not in acute distress.    Appearance: He is not ill-appearing, toxic-appearing or diaphoretic.  HENT:     Head: Normocephalic and atraumatic.  Eyes:     General: No scleral icterus.    Extraocular Movements: Extraocular movements intact.     Conjunctiva/sclera: Conjunctivae normal.     Pupils: Pupils are equal, round, and reactive to light.  Neck:     Musculoskeletal: Normal range of motion and neck supple. No neck rigidity.     Vascular: No carotid bruit.  Cardiovascular:     Rate and Rhythm: Normal rate and regular rhythm.  Pulmonary:     Effort: Pulmonary effort is normal. No respiratory distress.     Breath sounds: Normal breath sounds.  Musculoskeletal:     Right lower leg: No edema.     Left lower leg: No edema.  Lymphadenopathy:     Cervical: No cervical adenopathy.  Skin:    General: Skin is warm and dry.     Coloration: Skin is not jaundiced or pale.     Comments: Vitiligo patches more visible on top of head, on face around eyes and mouth, on arms at elbow creases and on tops of both hands.  Neurological:     General: No focal deficit present.     Mental Status: He is alert and oriented to person, place, and time.  Psychiatric:        Mood and Affect: Mood normal.  Behavior: Behavior normal.        Thought Content: Thought content normal.        Judgment: Judgment normal.    Today's Vitals   07/06/18 1320  BP: (!) 168/104  Pulse: 99  Resp: 18  Temp: 98.3 F (36.8 C)  TempSrc: Oral  SpO2: 94%  Weight: (!) 308 lb 9.6 oz (140 kg)  Height: 6\' 3"  (1.905 m)   Body mass index is 38.57 kg/m.      Assessment & Plan:    Hypertension-patient's BP is improved with the hydrochlorothiazide, but can be better.  Patient is agreeable to add another medication to help better control BP.  He will continue hydrochlorothiazide 25 mg daily and we will add  amlodipine 2.5 mg.  Patient encouraged to continue working on healthy diet choices leg lean proteins, lots of vegetables and avoiding excess sugars and high carbohydrate content foods.  Also recommended good water intake and physical activity like walking, lifting weights and building up stamina as tolerated.  Vitiligo-July ago patches are becoming more visible and larger.  He has seen dermatology and plans to continue to follow with them.  He will use topical creams as prescribed by dermatology and monitor vitiligo for now.  Patient does not think he will do any sort of laser therapy.  Patient will follow-up here in 2 weeks for recheck of blood pressure after medication changes.

## 2018-07-12 ENCOUNTER — Other Ambulatory Visit: Payer: Self-pay

## 2018-07-20 ENCOUNTER — Ambulatory Visit: Payer: 59 | Admitting: Family Medicine

## 2018-08-07 ENCOUNTER — Other Ambulatory Visit: Payer: Self-pay

## 2018-08-09 ENCOUNTER — Encounter (INDEPENDENT_AMBULATORY_CARE_PROVIDER_SITE_OTHER): Payer: Self-pay

## 2018-08-09 ENCOUNTER — Other Ambulatory Visit: Payer: Self-pay

## 2018-08-09 ENCOUNTER — Ambulatory Visit (INDEPENDENT_AMBULATORY_CARE_PROVIDER_SITE_OTHER): Payer: 59 | Admitting: Family Medicine

## 2018-08-09 ENCOUNTER — Encounter: Payer: Self-pay | Admitting: Family Medicine

## 2018-08-09 VITALS — BP 142/98 | HR 82 | Temp 98.1°F | Resp 18 | Ht 72.0 in | Wt 304.0 lb

## 2018-08-09 DIAGNOSIS — I10 Essential (primary) hypertension: Secondary | ICD-10-CM

## 2018-08-09 NOTE — Progress Notes (Signed)
Subjective:    Patient ID: Sean Norman, male    DOB: 1955-08-21, 63 y.o.   MRN: [id number]  HPI   Patient presents to clinic for follow-up on blood pressure after adding low-dose amlodipine 2.5 mg daily to blood pressure regimen.  Currently he is on hydrochlorothiazide 25 mg daily and amlodipine 2.5 mg daily.  Tolerating these medications without any problems.  Denies chest pain, palpitations or any lower extremity swelling.  Did end up getting a blood pressure cuff for home, checked BP a few days ago 143/92.  Patient Active Problem List   Diagnosis Date Noted  . Vitiligo 12/01/2017  . Hyperlipidemia 09/16/2015  . Essential hypertension 08/17/2015   Social History   Tobacco Use  . Smoking status: Never Smoker  . Smokeless tobacco: Never Used  Substance Use Topics  . Alcohol use: No   Review of Systems  Constitutional: Negative for chills, fatigue and fever.  HENT: Negative for congestion, ear pain, sinus pain and sore throat.   Eyes: Negative.   Respiratory: Negative for cough, shortness of breath and wheezing.   Cardiovascular: Negative for chest pain, palpitations and leg swelling.  Gastrointestinal: Negative for abdominal pain, diarrhea, nausea and vomiting.  Genitourinary: Negative for dysuria, frequency and urgency.  Musculoskeletal: Negative for arthralgias and myalgias.  Skin: Negative for color change, pallor and rash.  Neurological: Negative for syncope, light-headedness and headaches.  Psychiatric/Behavioral: The patient is not nervous/anxious.       Objective:   Physical Exam Vitals signs and nursing note reviewed.  Constitutional:      General: He is not in acute distress.    Appearance: He is not ill-appearing, toxic-appearing or diaphoretic.  HENT:     Head: Normocephalic and atraumatic.  Neck:     Musculoskeletal: Neck supple. No neck rigidity.     Vascular: No carotid bruit.  Cardiovascular:     Rate and Rhythm: Normal rate and regular rhythm.    Heart sounds: Normal heart sounds.  Pulmonary:     Effort: Pulmonary effort is normal. No respiratory distress.     Breath sounds: Normal breath sounds.  Skin:    General: Skin is warm and dry.     Capillary Refill: Capillary refill takes less than 2 seconds.     Coloration: Skin is not jaundiced or pale.  Neurological:     General: No focal deficit present.     Mental Status: He is alert and oriented to person, place, and time.     Gait: Gait normal.  Psychiatric:        Mood and Affect: Mood normal.        Behavior: Behavior normal.        Thought Content: Thought content normal.        Judgment: Judgment normal.    Today's Vitals   08/09/18 1333  BP: (!) 142/98  Pulse: 82  Resp: 18  Temp: 98.1 F (36.7 C)  TempSrc: Oral  SpO2: 95%  Weight: (!) 304 lb (137.9 kg)  Height: 6' (1.829 m)   Body mass index is 41.23 kg/m.     Assessment & Plan:    Essential hypertension - overall blood pressure is slightly improved but not quite at goal.  We will increase additional amlodipine from 2.5 mg daily to 5 mg daily and he will continue hydrochlorothiazide at current dose range.  Discussed healthy diet and regular exercise, recommended diet high in lean proteins, lots of vegetables and avoiding salt.  Recommended physical exercise including  walking, aerobic activity and lifting weights and building up physical stamina as tolerated.  Patient will follow-up in office in 2 months, he will call us in the next 1 to 2 weeks with a log of blood pressure readings.  He is aware he can return to clinic sooner if any issues arise.  Made patient aware that flu vaccinations will be available around September 2020.

## 2018-08-09 NOTE — Patient Instructions (Signed)
Take 2 of the 2.5 mg amlodipine to make dose 5 mg & to use up current supply  Let us know when you run out so we can send in new Rx

## 2018-09-06 ENCOUNTER — Telehealth: Payer: Self-pay | Admitting: Family Medicine

## 2018-09-06 ENCOUNTER — Telehealth: Payer: Self-pay | Admitting: *Deleted

## 2018-09-06 DIAGNOSIS — I10 Essential (primary) hypertension: Secondary | ICD-10-CM

## 2018-09-06 NOTE — Telephone Encounter (Signed)
Returned call to pt regarding his refill on his amlodipine. The prescription reads 2.5 mg tab and he is taking 5 mg tab. He is requesting a new prescription so he can get his medication. He took the last pills this morning.

## 2018-09-06 NOTE — Telephone Encounter (Signed)
Pt called about the status of the increased Rx for amLODipine (NORVASC) 5 MG tablet/ please advise when sent to pharmacy

## 2018-09-06 NOTE — Telephone Encounter (Signed)
Called Pt and told with him that he had 3 more refills on his Amlodipine that he could pick up. Pt stated that he didn't know. Pt stated he understood.

## 2018-09-07 ENCOUNTER — Telehealth: Payer: Self-pay | Admitting: Lab

## 2018-09-07 MED ORDER — AMLODIPINE BESYLATE 5 MG PO TABS: 5.0000 mg | ORAL_TABLET | Freq: Every day | ORAL | 1 refills | Status: DC

## 2018-09-07 NOTE — Telephone Encounter (Signed)
5 mg sent  This plan was noted in his office visit note from 7/30  LG

## 2018-09-07 NOTE — Telephone Encounter (Signed)
Called Pharmacy and they stated the Pt has 3 refills left. Called Pt and told him that I called the pharmacy and he has 3 refills left. Pt stated the pharmacy would not give him his medication or refill it because it is too soon. By the pt using 2 pills instead of 1 he used them all. The pharmacy told the Pt he just needs a new Rx for 5mg tab. 

## 2018-09-07 NOTE — Telephone Encounter (Signed)
Called Pharmacy and they stated the Pt has 3 refills left. Called Pt and told him that I called the pharmacy and he has 3 refills left. Pt stated the pharmacy would not give him his medication or refill it because it is too soon. By the pt using 2 pills instead of 1 he used them all. The pharmacy told the Pt he just needs a new Rx for 5mg  tab.

## 2018-09-07 NOTE — Addendum Note (Signed)
Addended by: Philis Nettle on: 09/07/2018 10:11 AM   Modules accepted: Orders

## 2018-09-07 NOTE — Telephone Encounter (Signed)
Called Pt and told him Rx sent to pharmacy

## 2018-11-15 ENCOUNTER — Ambulatory Visit: Payer: 59 | Admitting: Family Medicine

## 2019-01-31 ENCOUNTER — Encounter: Payer: Self-pay | Admitting: Podiatry

## 2019-01-31 ENCOUNTER — Ambulatory Visit: Payer: BLUE CROSS/BLUE SHIELD | Admitting: Podiatry

## 2019-01-31 ENCOUNTER — Other Ambulatory Visit: Payer: Self-pay

## 2019-01-31 ENCOUNTER — Ambulatory Visit (INDEPENDENT_AMBULATORY_CARE_PROVIDER_SITE_OTHER): Payer: BLUE CROSS/BLUE SHIELD

## 2019-01-31 VITALS — BP 158/100 | HR 80

## 2019-01-31 DIAGNOSIS — M2141 Flat foot [pes planus] (acquired), right foot: Secondary | ICD-10-CM

## 2019-01-31 DIAGNOSIS — B070 Plantar wart: Secondary | ICD-10-CM

## 2019-01-31 DIAGNOSIS — M2142 Flat foot [pes planus] (acquired), left foot: Secondary | ICD-10-CM

## 2019-01-31 DIAGNOSIS — L859 Epidermal thickening, unspecified: Secondary | ICD-10-CM | POA: Diagnosis not present

## 2019-01-31 NOTE — Patient Instructions (Signed)

## 2019-01-31 NOTE — Progress Notes (Signed)
  Subjective:  Patient ID: Sean Norman, male    DOB: [birth year],  MRN: [id number] HPI Chief Complaint  Patient presents with  . 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 Foot Pain    bilateral  . Callouses    painful callus lesion left foot - patient reports it has been there for almost 2 years    64 y.o. male presents with the above complaint.   ROS: Denies fever chills nausea vomiting muscle aches pains calf pain back pain chest pain shortness of breath.  Past Medical History:  Diagnosis Date  . Chicken pox   . Hypertension    Past Surgical History:  Procedure Laterality Date  . NO PAST SURGERIES      Current Outpatient Medications:  .  amLODipine (NORVASC) 5 MG tablet, Take 1 tablet (5 mg total) by mouth daily., Disp: 90 tablet, Rfl: 1 .  hydrochlorothiazide (HYDRODIURIL) 25 MG tablet, Take 1 tablet (25 mg total) by mouth daily., Disp: 90 tablet, Rfl: 3 .  mometasone (ELOCON) 0.1 % cream, Apply 1 application topically daily., Disp: , Rfl:  .  mometasone-formoterol (DULERA) 100-5 MCG/ACT AERO, Inhale 2 puffs into the lungs 2 (two) times daily., Disp: , Rfl:  .  pimecrolimus (ELIDEL) 1 % cream, Apply topically 2 (two) times daily., Disp: , Rfl:   No Known Allergies Review of Systems Objective:   Vitals:   01/31/19 1419  BP: (!) 158/100  Pulse: 80    General: Well developed, nourished, in no acute distress, alert and oriented x3   Dermatological: Skin is warm, dry and supple bilateral. Nails x 10 are well maintained; remaining integument appears unremarkable at this time. There are no open sores, no preulcerative lesions, no rash or signs of infection present.  He has a lesion to the plantar aspect of the fifth metatarsal base of the left foot.  This appears to be porokeratotic in nature with some signs of verrucoid change.  This exquisitely painful palpation.  Vascular: Dorsalis Pedis artery and Posterior Tibial artery  pedal pulses are 2/4 bilateral with immedate capillary fill time. Pedal hair growth present. No varicosities and no lower extremity edema present bilateral.   Neruologic: Grossly intact via light touch bilateral. Vibratory intact via tuning fork bilateral. Protective threshold with Semmes Wienstein monofilament intact to all pedal sites bilateral. Patellar and Achilles deep tendon reflexes 2+ bilateral. No Babinski or clonus noted bilateral.   Musculoskeletal: No gross boney pedal deformities bilateral. No pain, crepitus, or limitation noted with foot and ankle range of motion bilateral. Muscular strength 5/5 in all groups tested bilateral.  Flexible pes planus is noted bilateral.  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  Gait: Unassisted, Nonantalgic.    Radiographs:  Radiographs taken today demonstrate osseously mature individual with severe pes planus.  No coalitions are noted.  No acute osseous abnormalities identified.  Assessment & Plan:   Assessment: Severe pes planus bilateral plantar fasciitis bilateral solitary porokeratotic lesion plantar aspect of the fifth metatarsal base left foot.  Cannot rule out wart.  Plan: Surgical curettage plantar aspect left foot with excision of a very deep large porokeratotic lesion with very deep verrucoid tendencies.  I debrided this area after local anesthetic was administered.  He will follow up with [facility] in 1 week for orthotics.     Nil Xiong T. [place], [place]

## 2019-02-14 ENCOUNTER — Ambulatory Visit: Payer: BLUE CROSS/BLUE SHIELD | Admitting: Podiatry

## 2019-02-14 ENCOUNTER — Other Ambulatory Visit: Payer: Self-pay

## 2019-02-14 DIAGNOSIS — B070 Plantar wart: Secondary | ICD-10-CM

## 2019-02-14 DIAGNOSIS — M2141 Flat foot [pes planus] (acquired), right foot: Secondary | ICD-10-CM | POA: Diagnosis not present

## 2019-02-14 DIAGNOSIS — M2142 Flat foot [pes planus] (acquired), left foot: Secondary | ICD-10-CM | POA: Diagnosis not present

## 2019-02-16 NOTE — Progress Notes (Signed)
He presents today for follow-up of wart plantar aspect of the left foot which was excised January 31, 2019.  He denies fever chills nausea vomiting muscle aches pains calf pain back pain chest pain shortness of breath.  Objective: Area appears to be healing very nicely is no longer a pain present.  No signs of infection.  Assessment: Well-healing surgical foot.  Plan: Follow-up with me as needed.

## 2019-02-19 ENCOUNTER — Other Ambulatory Visit: Payer: BLUE CROSS/BLUE SHIELD | Admitting: Orthotics

## 2019-02-19 ENCOUNTER — Ambulatory Visit: Payer: BLUE CROSS/BLUE SHIELD | Admitting: Podiatry

## 2019-02-21 ENCOUNTER — Other Ambulatory Visit: Payer: BLUE CROSS/BLUE SHIELD | Admitting: Orthotics

## 2019-03-05 ENCOUNTER — Encounter: Payer: BC Managed Care – PPO | Admitting: Orthotics

## 2019-03-05 ENCOUNTER — Other Ambulatory Visit: Payer: Self-pay

## 2019-04-16 ENCOUNTER — Encounter: Payer: Self-pay | Admitting: Nurse Practitioner

## 2019-05-02 ENCOUNTER — Encounter: Payer: Self-pay | Admitting: Nurse Practitioner

## 2019-05-02 ENCOUNTER — Other Ambulatory Visit: Payer: Self-pay

## 2019-05-02 ENCOUNTER — Ambulatory Visit: Payer: BC Managed Care – PPO | Admitting: Nurse Practitioner

## 2019-05-02 VITALS — BP 160/102 | HR 82 | Temp 97.1°F | Ht 72.0 in | Wt 238.0 lb

## 2019-05-02 DIAGNOSIS — Z86010 Personal history of colonic polyps: Secondary | ICD-10-CM | POA: Insufficient documentation

## 2019-05-02 DIAGNOSIS — Z125 Encounter for screening for malignant neoplasm of prostate: Secondary | ICD-10-CM | POA: Diagnosis not present

## 2019-05-02 DIAGNOSIS — L80 Vitiligo: Secondary | ICD-10-CM

## 2019-05-02 DIAGNOSIS — E785 Hyperlipidemia, unspecified: Secondary | ICD-10-CM

## 2019-05-02 DIAGNOSIS — I10 Essential (primary) hypertension: Secondary | ICD-10-CM | POA: Diagnosis not present

## 2019-05-02 DIAGNOSIS — J301 Allergic rhinitis due to pollen: Secondary | ICD-10-CM | POA: Diagnosis not present

## 2019-05-02 DIAGNOSIS — J309 Allergic rhinitis, unspecified: Secondary | ICD-10-CM

## 2019-05-02 HISTORY — DX: Allergic rhinitis, unspecified: J30.9

## 2019-05-02 MED ORDER — AMLODIPINE BESYLATE 10 MG PO TABS: 10.0000 mg | ORAL_TABLET | Freq: Every day | ORAL | 3 refills | Status: DC

## 2019-05-02 NOTE — Assessment & Plan Note (Signed)
On amlodipine 5 mg daily and on  hydrochlorothiazide 25 mg daily. Home BP 132/82 .   BP Readings from Last 3 Encounters:  05/02/19 (!) 160/90  01/31/19 (!) 158/100  08/09/18 (!) 142/98   Plan: Increase amlodipine to 10  mg daily and stay on the HCTZ: 25 mg daily.

## 2019-05-02 NOTE — Patient Instructions (Addendum)
It was very nice to meet you today.  Please go to the laboratory after this visit.  I have ordered routine blood work we will call you when those are returned.  I have increased your amlodipine to 10 mg daily.  Please check your blood pressures at home and bring in a written record.  Office visit in 2 weeks to see how your blood pressure is responding.   Information on hypertension and DASH diet noted below.  Great work on weight loss.  Hypertension, Adult High blood pressure (hypertension) is when the force of blood pumping through the arteries is too strong. The arteries are the blood vessels that carry blood from the heart throughout the body. Hypertension forces the heart to work harder to pump blood and may cause arteries to become narrow or stiff. Untreated or uncontrolled hypertension can cause a heart attack, heart failure, a stroke, kidney disease, and other problems. A blood pressure reading consists of a higher number over a lower number. Ideally, your blood pressure should be below 120/80. The first ("top") number is called the systolic pressure. It is a measure of the pressure in your arteries as your heart beats. The second ("bottom") number is called the diastolic pressure. It is a measure of the pressure in your arteries as the heart relaxes. What are the causes? The exact cause of this condition is not known. There are some conditions that result in or are related to high blood pressure. What increases the risk? Some risk factors for high blood pressure are under your control. The following factors may make you more likely to develop this condition:  Smoking.  Having type 2 diabetes mellitus, high cholesterol, or both.  Not getting enough exercise or physical activity.  Being overweight.  Having too much fat, sugar, calories, or salt (sodium) in your diet.  Drinking too much alcohol. Some risk factors for high blood pressure may be difficult or impossible to change.  Some of these factors include:  Having chronic kidney disease.  Having a family history of high blood pressure.  Age. Risk increases with age.  Race. You may be at higher risk if you are African American.  Gender. Men are at higher risk than women before age 25. After age 77, women are at higher risk than men.  Having obstructive sleep apnea.  Stress. What are the signs or symptoms? High blood pressure may not cause symptoms. Very high blood pressure (hypertensive crisis) may cause:  Headache.  Anxiety.  Shortness of breath.  Nosebleed.  Nausea and vomiting.  Vision changes.  Severe chest pain.  Seizures. How is this diagnosed? This condition is diagnosed by measuring your blood pressure while you are seated, with your arm resting on a flat surface, your legs uncrossed, and your feet flat on the floor. The cuff of the blood pressure monitor will be placed directly against the skin of your upper arm at the level of your heart. It should be measured at least twice using the same arm. Certain conditions can cause a difference in blood pressure between your right and left arms. Certain factors can cause blood pressure readings to be lower or higher than normal for a short period of time:  When your blood pressure is higher when you are in a health care provider's office than when you are at home, this is called white coat hypertension. Most people with this condition do not need medicines.  When your blood pressure is higher at home than when you  are in a health care provider's office, this is called masked hypertension. Most people with this condition may need medicines to control blood pressure. If you have a high blood pressure reading during one visit or you have normal blood pressure with other risk factors, you may be asked to:  Return on a different day to have your blood pressure checked again.  Monitor your blood pressure at home for 1 week or longer. If you are  diagnosed with hypertension, you may have other blood or imaging tests to help your health care provider understand your overall risk for other conditions. How is this treated? This condition is treated by making healthy lifestyle changes, such as eating healthy foods, exercising more, and reducing your alcohol intake. Your health care provider may prescribe medicine if lifestyle changes are not enough to get your blood pressure under control, and if:  Your systolic blood pressure is above 130.  Your diastolic blood pressure is above 80. Your personal target blood pressure may vary depending on your medical conditions, your age, and other factors. Follow these instructions at home: Eating and drinking   Eat a diet that is high in fiber and potassium, and low in sodium, added sugar, and fat. An example eating plan is called the DASH (Dietary Approaches to Stop Hypertension) diet. To eat this way: ? Eat plenty of fresh fruits and vegetables. Try to fill one half of your plate at each meal with fruits and vegetables. ? Eat whole grains, such as whole-wheat pasta, brown rice, or whole-grain bread. Fill about one fourth of your plate with whole grains. ? Eat or drink low-fat dairy products, such as skim milk or low-fat yogurt. ? Avoid fatty cuts of meat, processed or cured meats, and poultry with skin. Fill about one fourth of your plate with lean proteins, such as fish, chicken without skin, beans, eggs, or tofu. ? Avoid pre-made and processed foods. These tend to be higher in sodium, added sugar, and fat.  Reduce your daily sodium intake. Most people with hypertension should eat less than 1,500 mg of sodium a day.  Do not drink alcohol if: ? Your health care provider tells you not to drink. ? You are pregnant, may be pregnant, or are planning to become pregnant.  If you drink alcohol: ? Limit how much you use to:  0-1 drink a day for women.  0-2 drinks a day for men. ? Be aware of how  much alcohol is in your drink. In the [place] one drink equals one 12 oz bottle of beer (355 mL), one 5 oz glass of wine (148 mL), or one 1 oz glass of hard liquor (44 mL). Lifestyle   Work with your health care provider to maintain a healthy body weight or to lose weight. Ask what an ideal weight is for you.  Get at least 30 minutes of exercise most days of the week. Activities may include walking, swimming, or biking.  Include exercise to strengthen your muscles (resistance exercise), such as Pilates or lifting weights, as part of your weekly exercise routine. Try to do these types of exercises for 30 minutes at least 3 days a week.  Do not use any products that contain nicotine or tobacco, such as cigarettes, e-cigarettes, and chewing tobacco. If you need help quitting, ask your health care provider.  Monitor your blood pressure at home as told by your health care provider.  Keep all follow-up visits as told by your health care provider. This is  important. Medicines  Take over-the-counter and prescription medicines only as told by your health care provider. Follow directions carefully. Blood pressure medicines must be taken as prescribed.  Do not skip doses of blood pressure medicine. Doing this puts you at risk for problems and can make the medicine less effective.  Ask your health care provider about side effects or reactions to medicines that you should watch for. Contact a health care provider if you:  Think you are having a reaction to a medicine you are taking.  Have headaches that keep coming back (recurring).  Feel dizzy.  Have swelling in your ankles.  Have trouble with your vision. Get help right away if you:  Develop a severe headache or confusion.  Have unusual weakness or numbness.  Feel faint.  Have severe pain in your chest or abdomen.  Vomit repeatedly.  Have trouble breathing. Summary  Hypertension is when the force of blood pumping through your  arteries is too strong. If this condition is not controlled, it may put you at risk for serious complications.  Your personal target blood pressure may vary depending on your medical conditions, your age, and other factors. For most people, a normal blood pressure is less than 120/80.  Hypertension is treated with lifestyle changes, medicines, or a combination of both. Lifestyle changes include losing weight, eating a healthy, low-sodium diet, exercising more, and limiting alcohol. This information is not intended to replace advice given to you by your health care provider. Make sure you discuss any questions you have with your health care provider. Document Revised: 09/06/2017 Document Reviewed: 09/06/2017 Elsevier Patient Education  2020 [employer]  DASH Eating Plan DASH stands for "Dietary Approaches to Stop Hypertension." The DASH eating plan is a healthy eating plan that has been shown to reduce high blood pressure (hypertension). It may also reduce your risk for type 2 diabetes, heart disease, and stroke. The DASH eating plan may also help with weight loss. What are tips for following this plan?  General guidelines  Avoid eating more than 2,300 mg (milligrams) of salt (sodium) a day. If you have hypertension, you may need to reduce your sodium intake to 1,500 mg a day.  Limit alcohol intake to no more than 1 drink a day for nonpregnant women and 2 drinks a day for men. One drink equals 12 oz of beer, 5 oz of wine, or 1 oz of hard liquor.  Work with your health care provider to maintain a healthy body weight or to lose weight. Ask what an ideal weight is for you.  Get at least 30 minutes of exercise that causes your heart to beat faster (aerobic exercise) most days of the week. Activities may include walking, swimming, or biking.  Work with your health care provider or diet and nutrition specialist (dietitian) to adjust your eating plan to your individual calorie needs. Reading food  labels   Check food labels for the amount of sodium per serving. Choose foods with less than 5 percent of the Daily Value of sodium. Generally, foods with less than 300 mg of sodium per serving fit into this eating plan.  To find whole grains, look for the word "whole" as the first word in the ingredient list. Shopping  Buy products labeled as "low-sodium" or "no salt added."  Buy fresh foods. Avoid canned foods and premade or frozen meals. Cooking  Avoid adding salt when cooking. Use salt-free seasonings or herbs instead of table salt or sea salt. Check with your  health care provider or pharmacist before using salt substitutes.  Do not fry foods. Cook foods using healthy methods such as baking, boiling, grilling, and broiling instead.  Cook with heart-healthy oils, such as olive, canola, soybean, or sunflower oil. Meal planning  Eat a balanced diet that includes: ? 5 or more servings of fruits and vegetables each day. At each meal, try to fill half of your plate with fruits and vegetables. ? Up to 6-8 servings of whole grains each day. ? Less than 6 oz of lean meat, poultry, or fish each day. A 3-oz serving of meat is about the same size as a deck of cards. One egg equals 1 oz. ? 2 servings of low-fat dairy each day. ? A serving of nuts, seeds, or beans 5 times each week. ? Heart-healthy fats. Healthy fats called Omega-3 fatty acids are found in foods such as flaxseeds and coldwater fish, like sardines, salmon, and mackerel.  Limit how much you eat of the following: ? Canned or prepackaged foods. ? Food that is high in trans fat, such as fried foods. ? Food that is high in saturated fat, such as fatty meat. ? Sweets, desserts, sugary drinks, and other foods with added sugar. ? Full-fat dairy products.  Do not salt foods before eating.  Try to eat at least 2 vegetarian meals each week.  Eat more home-cooked food and less restaurant, buffet, and fast food.  When eating at a  restaurant, ask that your food be prepared with less salt or no salt, if possible. What foods are recommended? The items listed may not be a complete list. Talk with your dietitian about what dietary choices are best for you. Grains Whole-grain or whole-wheat bread. Whole-grain or whole-wheat pasta. Brown rice. [place]. Bulgur. Whole-grain and low-sodium cereals. Pita bread. Low-fat, low-sodium crackers. Whole-wheat flour tortillas. Vegetables Fresh or frozen vegetables (raw, steamed, roasted, or grilled). Low-sodium or reduced-sodium tomato and vegetable juice. Low-sodium or reduced-sodium tomato sauce and tomato paste. Low-sodium or reduced-sodium canned vegetables. Fruits All fresh, dried, or frozen fruit. Canned fruit in natural juice (without added sugar). Meat and other protein foods Skinless chicken or [place]. Ground chicken or [place]. Pork with fat trimmed off. Fish and seafood. Egg whites. Dried beans, peas, or lentils. Unsalted nuts, nut butters, and seeds. Unsalted canned beans. Lean cuts of beef with fat trimmed off. Low-sodium, lean deli meat. Dairy Low-fat (1%) or fat-free (skim) milk. Fat-free, low-fat, or reduced-fat cheeses. Nonfat, low-sodium ricotta or cottage cheese. Low-fat or nonfat yogurt. Low-fat, low-sodium cheese. Fats and oils Soft margarine without trans fats. Vegetable oil. Low-fat, reduced-fat, or light mayonnaise and salad dressings (reduced-sodium). Canola, safflower, olive, soybean, and sunflower oils. Avocado. Seasoning and other foods Herbs. Spices. Seasoning mixes without salt. Unsalted popcorn and pretzels. Fat-free sweets. What foods are not recommended? The items listed may not be a complete list. Talk with your dietitian about what dietary choices are best for you. Grains Baked goods made with fat, such as croissants, muffins, or some breads. Dry pasta or rice meal packs. Vegetables Creamed or fried vegetables. Vegetables in a cheese sauce.  Regular canned vegetables (not low-sodium or reduced-sodium). Regular canned tomato sauce and paste (not low-sodium or reduced-sodium). Regular tomato and vegetable juice (not low-sodium or reduced-sodium). Angie Fava. Olives. Fruits Canned fruit in a light or heavy syrup. Fried fruit. Fruit in cream or butter sauce. Meat and other protein foods Fatty cuts of meat. Ribs. Fried meat. Berniece Salines. Sausage. Bologna and other processed lunch meats. Salami.  Fatback. Hotdogs. Bratwurst. Salted nuts and seeds. Canned beans with added salt. Canned or smoked fish. Whole eggs or egg yolks. Chicken or [place] with skin. Dairy Whole or 2% milk, cream, and half-and-half. Whole or full-fat cream cheese. Whole-fat or sweetened yogurt. Full-fat cheese. Nondairy creamers. Whipped toppings. Processed cheese and cheese spreads. Fats and oils Butter. Stick margarine. Lard. Shortening. Ghee. Bacon fat. Tropical oils, such as coconut, palm kernel, or palm oil. Seasoning and other foods Salted popcorn and pretzels. Onion salt, garlic salt, seasoned salt, table salt, and sea salt. Worcestershire sauce. Tartar sauce. Barbecue sauce. Teriyaki sauce. Soy sauce, including reduced-sodium. Steak sauce. Canned and packaged gravies. Fish sauce. Oyster sauce. Cocktail sauce. Horseradish that you find on the shelf. Ketchup. Mustard. Meat flavorings and tenderizers. Bouillon cubes. Hot sauce and Tabasco sauce. Premade or packaged marinades. Premade or packaged taco seasonings. Relishes. Regular salad dressings. Where to find more information:  National Heart, Lung, and Blood Institute: PopSteam.is  American Heart Association: www.heart.org Summary  The DASH eating plan is a healthy eating plan that has been shown to reduce high blood pressure (hypertension). It may also reduce your risk for type 2 diabetes, heart disease, and stroke.  With the DASH eating plan, you should limit salt (sodium) intake to 2,300 mg a day. If you have  hypertension, you may need to reduce your sodium intake to 1,500 mg a day.  When on the DASH eating plan, aim to eat more fresh fruits and vegetables, whole grains, lean proteins, low-fat dairy, and heart-healthy fats.  Work with your health care provider or diet and nutrition specialist (dietitian) to adjust your eating plan to your individual calorie needs. This information is not intended to replace advice given to you by your health care provider. Make sure you discuss any questions you have with your health care provider. Document Revised: 12/09/2016 Document Reviewed: 12/21/2015 Elsevier Patient Education  2020 [employer].

## 2019-05-02 NOTE — Assessment & Plan Note (Signed)
He sees DERM and tried the therapy and next step is laser. So, he declines that treatment.

## 2019-05-02 NOTE — Progress Notes (Signed)
Established Patient Office Visit  Subjective:  Patient ID: Sean Norman, male    DOB: Apr 11, 1955  Age: 64 y.o. MRN: [id number]  CC:  Chief Complaint  Patient presents with  . Annual Exam    physical    HPI Sean Norman presents to establish care with a new provider.   HTN: amlodipine 5 mg daily to blood pressure regimen.  Currently he is on hydrochlorothiazide 25 mg daily and amlodipine 2.5 mg daily.  He denies headaches, chest pain, pressure, palpitations, shortness of breath or DOE.  No edema.  His blood pressure at home runs 132/82.  Family history positive for stroke in father.  BP Readings from Last 3 Encounters:  05/02/19 (!) 160/102  01/31/19 (!) 158/100  08/09/18 (!) 142/98    HDL: He is on diet management.  Lab Results  Component Value Date   CHOL 200 (H) 12/01/2017   HDL 46 12/01/2017   LDLCALC 139 (H) 12/01/2017   TRIG 63 12/01/2017   CHOLHDL 4.3 12/01/2017    Obesity:  He has lost weight drinking water and eating less.   Wt Readings from Last 3 Encounters:  05/02/19 238 lb (108 kg)  08/09/18 (!) 304 lb (137.9 kg)  07/06/18 (!) 308 lb 9.6 oz (140 kg)   History of colon polyps: [facility]  [facility] showed polyps and was told to come back in 5 years- did not.  Immunizations: No Covid, no Flu , Td 2013 Diet: Exercise Vision: last year Dentist:  PSA:   Past Medical History:  Diagnosis Date  . Allergic rhinitis 05/02/2019  . Chicken pox   . Hypertension     Past Surgical History:  Procedure Laterality Date  . NO PAST SURGERIES      Family History  Problem Relation Age of Onset  . Heart disease Mother   . Hypertension Mother   . Kidney disease Mother   . Diabetes Mother   . Heart disease Father   . Stroke Father   . Hypertension Father   . Breast cancer Maternal Grandmother   . Hypertension Maternal Grandmother   . Heart disease Maternal Grandmother   . Hypertension Maternal Grandfather   . Stroke Maternal Grandfather   .  Heart disease Maternal Grandfather   . Diabetes Paternal Grandfather     Social History   Socioeconomic History  . Marital status: Married    Spouse name: Not on file  . Number of children: Not on file  . Years of education: Not on file  . Highest education level: Not on file  Occupational History  . Not on file  Tobacco Use  . Smoking status: Never Smoker  . Smokeless tobacco: Never Used  Substance and Sexual Activity  . Alcohol use: No  . Drug use: No  . Sexual activity: Not on file  Other Topics Concern  . Not on file  Social History Narrative  . Not on file   Social Determinants of Health   Financial Resource Strain:   . Difficulty of Paying Living Expenses:   Food Insecurity:   . Worried About Charity fundraiser in the Last Year:   . Arboriculturist in the Last Year:   Transportation Needs:   . Film/video editor (Medical):   Marland Kitchen Lack of Transportation (Non-Medical):   Physical Activity:   . Days of Exercise per Week:   . Minutes of Exercise per Session:   Stress:   . Feeling of Stress :  Social Connections:   . Frequency of Communication with Friends and Family:   . Frequency of Social Gatherings with Friends and Family:   . Attends Religious Services:   . Active Member of Clubs or Organizations:   . Attends Archivist Meetings:   Marland Kitchen Marital Status:   Intimate Partner Violence:   . Fear of Current or Ex-Partner:   . Emotionally Abused:   Marland Kitchen Physically Abused:   . Sexually Abused:     Outpatient Medications Prior to Visit  Medication Sig Dispense Refill  . hydrochlorothiazide (HYDRODIURIL) 25 MG tablet Take 1 tablet (25 mg total) by mouth daily. 90 tablet 3  . mometasone-formoterol (DULERA) 100-5 MCG/ACT AERO Inhale 2 puffs into the lungs 2 (two) times daily.    Marland Kitchen amLODipine (NORVASC) 5 MG tablet Take 1 tablet (5 mg total) by mouth daily. 90 tablet 1  . mometasone (ELOCON) 0.1 % cream Apply 1 application topically daily.    . pimecrolimus  (ELIDEL) 1 % cream Apply topically 2 (two) times daily.     No facility-administered medications prior to visit.    No Known Allergies  ROS Review of Systems  Constitutional: Negative for chills, fatigue, fever and unexpected weight change.  HENT: Negative for congestion and sore throat.   Eyes: Negative.   Respiratory: Negative for cough and shortness of breath.   Cardiovascular: Negative for chest pain, palpitations and leg swelling.  Gastrointestinal: Negative for abdominal pain, blood in stool, constipation, diarrhea and nausea.  Endocrine: Negative.   Genitourinary: Negative for difficulty urinating and dysuria.  Musculoskeletal: Negative.   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  Neurological: Negative for dizziness, tremors, weakness and headaches.  Hematological: Negative.   Psychiatric/Behavioral: Negative.       Objective:    Physical Exam  Constitutional: He is oriented to person, place, and time. He appears well-developed and well-nourished.  HENT:  Head: Normocephalic and atraumatic.  Eyes: Pupils are equal, round, and reactive to light. Conjunctivae are normal.  Cardiovascular: Normal rate and regular rhythm.  Pulmonary/Chest: Effort normal and breath sounds normal.  Abdominal: Soft. Bowel sounds are normal.  Musculoskeletal:        General: Normal range of motion.     Cervical back: Neck supple.  Neurological: He is alert and oriented to person, place, and time.  Skin: Skin is warm and dry.  Psychiatric: He has a normal mood and affect. His behavior is normal. Judgment and thought content normal.  No concerns about depression or anxiety.   Vitals reviewed.   BP (!) 160/102 (BP Location: Right Arm, Cuff Size: Large)   Pulse 82   Temp (!) 97.1 F (36.2 C) (Temporal)   Ht 6' (1.829 m)   Wt 238 lb (108 kg)   SpO2 95%   BMI  32.28 kg/m  Wt Readings from Last 3 Encounters:  05/02/19 238 lb (108 kg)  08/09/18 (!) 304 lb (137.9 kg)  07/06/18 (!) 308 lb 9.6 oz (140 kg)     Health Maintenance Due  Topic Date Due  . Hepatitis C Screening  Never done  . HIV Screening  09/08/1971  . COVID-19 Vaccine (1) Never done    There are no preventive care reminders to display for this patient.  Lab Results  Component Value Date   TSH 2.45 12/01/2017   Lab Results  Component Value  Date   WBC 6.4 12/01/2017   HGB 14.1 12/01/2017   HCT 42.8 12/01/2017   MCV 91.6 12/01/2017   PLT 276.0 12/01/2017   Lab Results  Component Value Date   NA 141 12/01/2017   K 4.5 12/01/2017   CO2 28 12/01/2017   GLUCOSE 94 12/01/2017   BUN 14 12/01/2017   CREATININE 1.04 12/01/2017   BILITOT 0.4 12/01/2017   ALKPHOS 89 08/17/2015   AST 16 12/01/2017   ALT 15 12/01/2017   PROT 6.9 12/01/2017   ALBUMIN 4.4 08/17/2015   CALCIUM 9.3 12/01/2017   GFR 109.69 08/17/2015   Lab Results  Component Value Date   CHOL 200 (H) 12/01/2017   Lab Results  Component Value Date   HDL 46 12/01/2017   Lab Results  Component Value Date   LDLCALC 139 (H) 12/01/2017   Lab Results  Component Value Date   TRIG 63 12/01/2017   Lab Results  Component Value Date   CHOLHDL 4.3 12/01/2017   Lab Results  Component Value Date   HGBA1C 5.6 08/17/2015      Assessment & Plan:   Problem List Items Addressed This Visit      Cardiovascular and Mediastinum   Essential hypertension    On amlodipine 5 mg daily and on  hydrochlorothiazide 25 mg daily. Home BP 132/82 .   BP Readings from Last 3 Encounters:  05/02/19 (!) 160/90  01/31/19 (!) 158/100  08/09/18 (!) 142/98   Plan: Increase amlodipine to 10  mg daily and stay on the HCTZ: 25 mg daily.      Relevant Medications   amLODipine (NORVASC) 10 MG tablet   Other Relevant Orders   CBC with Differential/Platelet   Comp Met (CMET)   TSH   Urinalysis, Routine w reflex microscopic    Urine Microalbumin w/creat. ratio     Respiratory   Allergic rhinitis    He uses OTC Allegra- works well.         Musculoskeletal and Integument   Vitiligo    He sees DERM and tried the therapy and next step is laser. So, he declines that treatment.         Other   Hyperlipidemia    Recheck lipids.       Relevant Medications   amLODipine (NORVASC) 10 MG tablet   Other Relevant Orders   Lipid Profile   History of colon polyps   Relevant Orders   Ambulatory referral to Gastroenterology    Other Visit Diagnoses    Prostate cancer screening    -  Primary   Relevant Orders   PSA      Meds ordered this encounter  Medications  . amLODipine (NORVASC) 10 MG tablet    Sig: Take 1 tablet (10 mg total) by mouth daily.    Dispense:  90 tablet    Refill:  3    Order Specific Question:   Supervising Provider    Answer:   Einar Pheasant [[id number]]   Please go to the laboratory after this visit.  I have ordered routine blood work we will call you when those are returned.  I have increased your amlodipine to 10 mg daily.  Please check your blood pressures at home and bring in a written record.  Office visit in 2 weeks to see how your blood pressure is responding.   Information on hypertension and DASH diet noted below.  Great work on weight loss.  This visit occurred during the SARS-CoV-2 public health  emergency.  Safety protocols were in place, including screening questions prior to the visit, additional usage of staff PPE, and extensive cleaning of exam room while observing appropriate contact time as indicated for disinfecting solutions.   Follow-up: Return in about 2 weeks (around 05/16/2019).    Denice Paradise, NP

## 2019-05-02 NOTE — Assessment & Plan Note (Signed)
Recheck lipids

## 2019-05-02 NOTE — Assessment & Plan Note (Signed)
He uses OTC Careers adviser- works well.

## 2019-05-03 ENCOUNTER — Encounter: Payer: Self-pay | Admitting: Nurse Practitioner

## 2019-05-03 ENCOUNTER — Telehealth: Payer: Self-pay | Admitting: Nurse Practitioner

## 2019-05-03 LAB — CBC WITH DIFFERENTIAL/PLATELET
Basophils Absolute: 0 10*3/uL (ref 0.0–0.1)
Basophils Relative: 0.9 % (ref 0.0–3.0)
Eosinophils Absolute: 0.2 10*3/uL (ref 0.0–0.7)
Eosinophils Relative: 4.4 % (ref 0.0–5.0)
HCT: 42.4 % (ref 39.0–52.0)
Hemoglobin: 14.2 g/dL (ref 13.0–17.0)
Lymphocytes Relative: 31.4 % (ref 12.0–46.0)
Lymphs Abs: 1.5 10*3/uL (ref 0.7–4.0)
MCHC: 33.5 g/dL (ref 30.0–36.0)
MCV: 91.4 fl (ref 78.0–100.0)
Monocytes Absolute: 0.4 10*3/uL (ref 0.1–1.0)
Monocytes Relative: 7.9 % (ref 3.0–12.0)
Neutro Abs: 2.7 10*3/uL (ref 1.4–7.7)
Neutrophils Relative %: 55.4 % (ref 43.0–77.0)
Platelets: 259 10*3/uL (ref 150.0–400.0)
RBC: 4.64 Mil/uL (ref 4.22–5.81)
RDW: 13.4 % (ref 11.5–15.5)
WBC: 4.9 10*3/uL (ref 4.0–10.5)

## 2019-05-03 LAB — PSA: PSA: 1.36 ng/mL (ref 0.10–4.00)

## 2019-05-03 LAB — COMPREHENSIVE METABOLIC PANEL
ALT: 19 U/L (ref 0–53)
AST: 17 U/L (ref 0–37)
Albumin: 4.3 g/dL (ref 3.5–5.2)
Alkaline Phosphatase: 89 U/L (ref 39–117)
BUN: 18 mg/dL (ref 6–23)
CO2: 31 mEq/L (ref 19–32)
Calcium: 9.5 mg/dL (ref 8.4–10.5)
Chloride: 99 mEq/L (ref 96–112)
Creatinine, Ser: 0.97 mg/dL (ref 0.40–1.50)
GFR: 94.69 mL/min (ref >60.00)
Glucose, Bld: 91 mg/dL (ref 70–99)
Potassium: 4.1 mEq/L (ref 3.5–5.1)
Sodium: 137 mEq/L (ref 135–145)
Total Bilirubin: 0.7 mg/dL (ref 0.2–1.2)
Total Protein: 7.5 g/dL (ref 6.0–8.3)

## 2019-05-03 LAB — LIPID PANEL
Cholesterol: 236 mg/dL — ABNORMAL HIGH (ref 0–200)
HDL: 43.5 mg/dL (ref >39.00)
LDL Cholesterol: 162 mg/dL — ABNORMAL HIGH (ref 0–99)
NonHDL: 192.2
Total CHOL/HDL Ratio: 5
Triglycerides: 153 mg/dL — ABNORMAL HIGH (ref 0.0–149.0)
VLDL: 30.6 mg/dL (ref 0.0–40.0)

## 2019-05-03 LAB — TSH: TSH: 2.69 u[IU]/mL (ref 0.35–4.50)

## 2019-05-03 NOTE — Telephone Encounter (Signed)
-----   Message from Davis Gourd, CMA sent at 05/03/2019  2:43 PM EDT ----- Patient was willing to start a low dose statin. He worries about side effects, but he will try & see how he feels. Please send to [facility] at the corner [facility].

## 2019-05-03 NOTE — Telephone Encounter (Signed)
We decided to hold off on starting the statin until he comes in for BP re check 05/16/19 since we are focusing on his BP now.

## 2019-05-03 NOTE — Progress Notes (Signed)
We decided to not start the statin, yet. We are working on his BP and will take one step at a time. He sees me for BP check  on 05/16/19 and we can start it then.

## 2019-05-09 ENCOUNTER — Ambulatory Visit: Payer: BC Managed Care – PPO | Attending: Family

## 2019-05-09 ENCOUNTER — Other Ambulatory Visit: Payer: Self-pay

## 2019-05-09 ENCOUNTER — Telehealth (INDEPENDENT_AMBULATORY_CARE_PROVIDER_SITE_OTHER): Payer: Self-pay | Admitting: Gastroenterology

## 2019-05-09 ENCOUNTER — Other Ambulatory Visit: Payer: BC Managed Care – PPO

## 2019-05-09 DIAGNOSIS — I10 Essential (primary) hypertension: Secondary | ICD-10-CM

## 2019-05-09 DIAGNOSIS — Z23 Encounter for immunization: Secondary | ICD-10-CM

## 2019-05-09 DIAGNOSIS — Z86010 Personal history of colonic polyps: Secondary | ICD-10-CM

## 2019-05-09 NOTE — Progress Notes (Signed)
   Covid-19 Vaccination Clinic  Name:  Sean Norman    MRN: [id number] DOB: [birth year]  05/09/2019  Mr. [name] was observed post Covid-19 immunization for 15 minutes without incident. He was provided with Vaccine Information Sheet and instruction to access the V-Safe system.   Mr. Age was instructed to call 911 with any severe reactions post vaccine: Marland Kitchen Difficulty breathing  . Swelling of face and throat  . A fast heartbeat  . A bad rash all over body  . Dizziness and weakness   Immunizations Administered    Name Date Dose VIS Date Route   Moderna COVID-19 Vaccine 05/09/2019  1:35 PM 0.5 mL 12/2018 Intramuscular   Manufacturer: Moderna   Lot: [id number]   NDC: 95188-416-60

## 2019-05-09 NOTE — Progress Notes (Signed)
Gastroenterology Pre-Procedure Review  Request Date: Thursday 05/23/19 Requesting Physician: Dr. Tobi Bastos  PATIENT REVIEW QUESTIONS: The patient responded to the following health history questions as indicated:    1. Are you having any GI issues? no 2. Do you have a personal history of Polyps? yes (2014 Colon Polyps Colonoscopy performed in [place]) 3. Do you have a family history of Colon Cancer or Polyps? no 4. Diabetes Mellitus? no 5. Joint replacements in the past 12 months?no 6. Major health problems in the past 3 months?no 7. Any artificial heart valves, MVP, or defibrillator?no    MEDICATIONS & ALLERGIES:    Patient reports the following regarding taking any anticoagulation/antiplatelet therapy:   Plavix, Coumadin, Eliquis, Xarelto, Lovenox, Pradaxa, Brilinta, or Effient? no Aspirin? no  Patient confirms/reports the following medications:  Current Outpatient Medications  Medication Sig Dispense Refill  . amLODipine (NORVASC) 10 MG tablet Take 1 tablet (10 mg total) by mouth daily. 90 tablet 3  . hydrochlorothiazide (HYDRODIURIL) 25 MG tablet Take 1 tablet (25 mg total) by mouth daily. 90 tablet 3  . mometasone (ELOCON) 0.1 % cream Apply 1 application topically daily.    . mometasone-formoterol (DULERA) 100-5 MCG/ACT AERO Inhale 2 puffs into the lungs 2 (two) times daily.    . pimecrolimus (ELIDEL) 1 % cream Apply topically 2 (two) times daily.     No current facility-administered medications for this visit.    Patient confirms/reports the following allergies:  No Known Allergies  No orders of the defined types were placed in this encounter.   AUTHORIZATION INFORMATION Primary Insurance: 1D#: Group #:  Secondary Insurance: 1D#: Group #:  SCHEDULE INFORMATION: Date: Thursday 05/23/19 Time: Location:[facility]

## 2019-05-10 LAB — URINALYSIS, ROUTINE W REFLEX MICROSCOPIC
Bilirubin Urine: NEGATIVE
Hgb urine dipstick: NEGATIVE
Ketones, ur: NEGATIVE
Leukocytes,Ua: NEGATIVE
Nitrite: NEGATIVE
RBC / HPF: NONE SEEN (ref 0–?)
Specific Gravity, Urine: 1.025 (ref 1.000–1.030)
Total Protein, Urine: NEGATIVE
Urine Glucose: NEGATIVE
Urobilinogen, UA: 0.2 (ref 0.0–1.0)
WBC, UA: NONE SEEN (ref 0–?)
pH: 7.5 (ref 5.0–8.0)

## 2019-05-10 LAB — MICROALBUMIN / CREATININE URINE RATIO
Creatinine,U: 143.3 mg/dL
Microalb Creat Ratio: 1.1 mg/g (ref 0.0–30.0)
Microalb, Ur: 1.6 mg/dL (ref 0.0–1.9)

## 2019-05-14 ENCOUNTER — Other Ambulatory Visit: Payer: Self-pay

## 2019-05-16 ENCOUNTER — Encounter: Payer: Self-pay | Admitting: Nurse Practitioner

## 2019-05-16 ENCOUNTER — Ambulatory Visit: Payer: BC Managed Care – PPO | Admitting: Nurse Practitioner

## 2019-05-16 ENCOUNTER — Other Ambulatory Visit: Payer: Self-pay

## 2019-05-16 VITALS — BP 130/80 | HR 63 | Temp 97.1°F | Wt 257.0 lb

## 2019-05-16 DIAGNOSIS — Z6832 Body mass index (BMI) 32.0-32.9, adult: Secondary | ICD-10-CM

## 2019-05-16 DIAGNOSIS — E785 Hyperlipidemia, unspecified: Secondary | ICD-10-CM

## 2019-05-16 DIAGNOSIS — I10 Essential (primary) hypertension: Secondary | ICD-10-CM

## 2019-05-16 DIAGNOSIS — Z1159 Encounter for screening for other viral diseases: Secondary | ICD-10-CM

## 2019-05-16 DIAGNOSIS — Z114 Encounter for screening for human immunodeficiency virus [HIV]: Secondary | ICD-10-CM

## 2019-05-16 MED ORDER — ROSUVASTATIN CALCIUM 10 MG PO TABS
10.0000 mg | ORAL_TABLET | Freq: Every day | ORAL | 0 refills | Status: DC
Start: 2019-05-16 — End: 2019-08-19

## 2019-05-16 NOTE — Progress Notes (Signed)
Established Patient Office Visit  Subjective:  Patient ID: Sean Norman, male    DOB: [birth year]  Age: 64 y.o. MRN: [id number]  CC:  Chief Complaint  Patient presents with  . Follow-up    hypertension    HPI [name] presents for a 2 week follow-up of hypertension: He presents on amlodipine 10 mg daily and hydrochlorothiazide 25 mg daily.  He denies any headaches, chest pain, pressure, tightness, palpitations, dizziness, lightheadedness, shortness of breath or DOE.  No edema.  His blood pressures at home are running 129/86-134/94.  BP Readings from Last 3 Encounters:  05/02/19 (!) 160/102  01/31/19 (!) 158/100  08/09/18 (!) 142/98    HDL: Diet management.  He is been trying to lose weight Lab Results  Component Value Date   CHOL 236 (H) 05/02/2019   CHOL 200 (H) 12/01/2017   CHOL 208 (H) 08/17/2015   Lab Results  Component Value Date   HDL 43.50 05/02/2019   HDL 46 12/01/2017   HDL 56.40 08/17/2015   Lab Results  Component Value Date   LDLCALC 162 (H) 05/02/2019   LDLCALC 139 (H) 12/01/2017   LDLCALC 137 (H) 08/17/2015   Lab Results  Component Value Date   TRIG 153.0 (H) 05/02/2019   TRIG 63 12/01/2017   TRIG 75.0 08/17/2015   Lab Results  Component Value Date   CHOLHDL 5 05/02/2019   CHOLHDL 4.3 12/01/2017   CHOLHDL 4 08/17/2015   No results found for: LDLDIRECT   Obesity:  Wt Readings from Last 3 Encounters:  05/02/19 238 lb (108 kg)  08/09/18 (!) 304 lb (137.9 kg)  07/06/18 (!) 308 lb 9.6 oz (140 kg)   Immunizations: Covid 05/09/19,  Td 2013   Past Medical History:  Diagnosis Date  . Allergic rhinitis 05/02/2019  . Chicken pox   . Hypertension   . Vitiligo     Past Surgical History:  Procedure Laterality Date  . NO PAST SURGERIES      Family History  Problem Relation Age of Onset  . Heart disease Mother   . Hypertension Mother   . Kidney disease Mother   . Diabetes Mother   . Heart disease Father   . Stroke Father   .  Hypertension Father   . Breast cancer Maternal Grandmother   . Hypertension Maternal Grandmother   . Heart disease Maternal Grandmother   . Hypertension Maternal Grandfather   . Stroke Maternal Grandfather   . Heart disease Maternal Grandfather   . Diabetes Paternal Grandfather     Social History   Socioeconomic History  . Marital status: Married    Spouse name: Not on file  . Number of children: Not on file  . Years of education: Not on file  . Highest education level: Not on file  Occupational History  . Not on file  Tobacco Use  . Smoking status: Never Smoker  . Smokeless tobacco: Never Used  Substance and Sexual Activity  . Alcohol use: No  . Drug use: No  . Sexual activity: Not on file  Other Topics Concern  . Not on file  Social History Narrative  . Not on file   Social Determinants of Health   Financial Resource Strain:   . Difficulty of Paying Living Expenses:   Food Insecurity:   . Worried About Charity fundraiser in the Last Year:   . Arboriculturist in the Last Year:   Transportation Needs:   . Film/video editor (Medical):   Marland Kitchen  Lack of Transportation (Non-Medical):   Physical Activity:   . Days of Exercise per Week:   . Minutes of Exercise per Session:   Stress:   . Feeling of Stress :   Social Connections:   . Frequency of Communication with Friends and Family:   . Frequency of Social Gatherings with Friends and Family:   . Attends Religious Services:   . Active Member of Clubs or Organizations:   . Attends Banker Meetings:   Marland Kitchen Marital Status:   Intimate Partner Violence:   . Fear of Current or Ex-Partner:   . Emotionally Abused:   Marland Kitchen Physically Abused:   . Sexually Abused:     Outpatient Medications Prior to Visit  Medication Sig Dispense Refill  . amLODipine (NORVASC) 10 MG tablet Take 1 tablet (10 mg total) by mouth daily. 90 tablet 3  . hydrochlorothiazide (HYDRODIURIL) 25 MG tablet Take 1 tablet (25 mg total) by mouth  daily. 90 tablet 3  . mometasone (ELOCON) 0.1 % cream Apply 1 application topically daily.    . mometasone-formoterol (DULERA) 100-5 MCG/ACT AERO Inhale 2 puffs into the lungs 2 (two) times daily.    . pimecrolimus (ELIDEL) 1 % cream Apply topically 2 (two) times daily.     No facility-administered medications prior to visit.    No Known Allergies  ROS  Review of Systems  Constitutional: Negative.   HENT: Negative.   Respiratory: Negative for cough and shortness of breath.   Cardiovascular: Negative for chest pain and leg swelling.  Gastrointestinal: Negative for abdominal pain.  Genitourinary: Negative.   Musculoskeletal: Negative.   Allergic/Immunologic: Negative.   Neurological: Negative.   Hematological: Negative.   Psychiatric/Behavioral: Negative.        No concerns about depression/anxiety      Objective:    Physical Exam  Constitutional: He is oriented to person, place, and time. He appears well-developed and well-nourished.  HENT:  Head: Normocephalic.  Cardiovascular: Normal rate, regular rhythm and normal heart sounds.  Pulmonary/Chest: Effort normal and breath sounds normal.  Abdominal: Soft. There is no abdominal tenderness.  Neurological: He is alert and oriented to person, place, and time.  Skin: Skin is warm and dry.  Vitiligo   Psychiatric: He has a normal mood and affect. His behavior is normal. Judgment and thought content normal.  Vitals reviewed.   There were no vitals taken for this visit. Wt Readings from Last 3 Encounters:  05/02/19 238 lb (108 kg)  08/09/18 (!) 304 lb (137.9 kg)  07/06/18 (!) 308 lb 9.6 oz (140 kg)     Health Maintenance Due  Topic Date Due  . Hepatitis C Screening  Never done  . HIV Screening  09/08/1971    There are no preventive care reminders to display for this patient.  Lab Results  Component Value Date   TSH 2.69 05/02/2019   Lab Results  Component Value Date   WBC 4.9 05/02/2019   HGB 14.2 05/02/2019     HCT 42.4 05/02/2019   MCV 91.4 05/02/2019   PLT 259.0 05/02/2019   Lab Results  Component Value Date   NA 137 05/02/2019   K 4.1 05/02/2019   CO2 31 05/02/2019   GLUCOSE 91 05/02/2019   BUN 18 05/02/2019   CREATININE 0.97 05/02/2019   BILITOT 0.7 05/02/2019   ALKPHOS 89 05/02/2019   AST 17 05/02/2019   ALT 19 05/02/2019   PROT 7.5 05/02/2019   ALBUMIN 4.3 05/02/2019   CALCIUM 9.5 05/02/2019  GFR 94.69 05/02/2019   Lab Results  Component Value Date   CHOL 236 (H) 05/02/2019   Lab Results  Component Value Date   HDL 43.50 05/02/2019   Lab Results  Component Value Date   LDLCALC 162 (H) 05/02/2019   Lab Results  Component Value Date   TRIG 153.0 (H) 05/02/2019   Lab Results  Component Value Date   CHOLHDL 5 05/02/2019   Lab Results  Component Value Date   HGBA1C 5.6 08/17/2015      Assessment & Plan:   Problem List Items Addressed This Visit      Cardiovascular and Mediastinum   Essential hypertension - Primary   Relevant Medications   rosuvastatin (CRESTOR) 10 MG tablet     Other   Hyperlipidemia   Relevant Medications   rosuvastatin (CRESTOR) 10 MG tablet   Other Relevant Orders   Lipid Profile   Hepatic function panel    Other Visit Diagnoses    Need for hepatitis C screening test       Relevant Orders   Hepatitis C Antibody   Screening for HIV (human immunodeficiency virus)       Relevant Orders   HIV antibody (with reflex)   BMI 32.0-32.9,adult       Relevant Orders   HgB A1c      Meds ordered this encounter  Medications  . rosuvastatin (CRESTOR) 10 MG tablet    Sig: Take 1 tablet (10 mg total) by mouth daily.    Dispense:  90 tablet    Refill:  0    Order Specific Question:   Supervising Provider    Answer:   Dale  [[id number]]   Great job with weight loss and keep up the good work!  I have added a cholesterol medicine called Crestor 10 mg daily.  Let me know if you have any problems with muscle aches with this  medicine. This one does not have that side effect as often as the others.  Counseled regarding low-cholesterol diet, DASH diet, given handouts.  We will need to see you back in the office in 8 weeks to recheck your labs.  Follow-up: Return in about 2 months (around 07/16/2019).    Amedeo Kinsman, NP

## 2019-05-16 NOTE — Patient Instructions (Addendum)
It was nice to see you today.   Great job with weight loss and keep up the good work!  I have added a cholesterol medicine called Crestor 10 mg daily.  Let me know if you have any problems with muscle aches with this medicine. This one does not have that side effect as often as the others.  We will need to see you back in the office in 8 weeks to recheck your labs.  Dyslipidemia Dyslipidemia is an imbalance of waxy, fat-like substances (lipids) in the blood. The body needs lipids in small amounts. Dyslipidemia often involves a high level of cholesterol or triglycerides, which are types of lipids. Common forms of dyslipidemia include:  High levels of LDL cholesterol. LDL is the type of cholesterol that causes fatty deposits (plaques) to build up in the blood vessels that carry blood away from your heart (arteries).  Low levels of HDL cholesterol. HDL cholesterol is the type of cholesterol that protects against heart disease. High levels of HDL remove the LDL buildup from arteries.  High levels of triglycerides. Triglycerides are a fatty substance in the blood that is linked to a buildup of plaques in the arteries. What are the causes? Primary dyslipidemia is caused by changes (mutations) in genes that are passed down through families (inherited). These mutations cause several types of dyslipidemia. Secondary dyslipidemia is caused by lifestyle choices and diseases that lead to dyslipidemia, such as:  Eating a diet that is high in animal fat.  Not getting enough exercise.  Having diabetes, kidney disease, liver disease, or thyroid disease.  Drinking large amounts of alcohol.  Using certain medicines. What increases the risk? You are more likely to develop this condition if you are an older man or if you are a woman who has gone through menopause. Other risk factors include:  Having a family history of dyslipidemia.  Taking certain medicines, including birth control pills, steroids,  some diuretics, and beta-blockers.  Smoking cigarettes.  Eating a high-fat diet.  Having certain medical conditions such as diabetes, polycystic ovary syndrome (PCOS), kidney disease, liver disease, or hypothyroidism.  Not exercising regularly.  Being overweight or obese with too much belly fat. What are the signs or symptoms? In most cases, dyslipidemia does not usually cause any symptoms. In severe cases, very high lipid levels can cause:  Fatty bumps under the skin (xanthomas).  White or gray ring around the black center (pupil) of the eye. Very high triglyceride levels can cause inflammation of the pancreas (pancreatitis). How is this diagnosed? Your health care provider may diagnose dyslipidemia based on a routine blood test (fasting blood test). Because most people do not have symptoms of the condition, this blood testing (lipid profile) is done on adults age 35 and older and is repeated every 5 years. This test checks:  Total cholesterol. This measures the total amount of cholesterol in your blood, including LDL cholesterol, HDL cholesterol, and triglycerides. A healthy number is below 200.  LDL cholesterol. The target number for LDL cholesterol is different for each person, depending on individual risk factors. Ask your health care provider what your LDL cholesterol should be.  HDL cholesterol. An HDL level of 60 or higher is best because it helps to protect against heart disease. A number below 40 for men or below 50 for women increases the risk for heart disease.  Triglycerides. A healthy triglyceride number is below 150. If your lipid profile is abnormal, your health care provider may do other blood tests. How  is this treated? Treatment depends on the type of dyslipidemia that you have and your other risk factors for heart disease and stroke. Your health care provider will have a target range for your lipid levels based on this information. For many people, this condition  may be treated by lifestyle changes, such as diet and exercise. Your health care provider may recommend that you:  Get regular exercise.  Make changes to your diet.  Quit smoking if you smoke. If diet changes and exercise do not help you reach your goals, your health care provider may also prescribe medicine to lower lipids. The most commonly prescribed type of medicine lowers your LDL cholesterol (statin drug). If you have a high triglyceride level, your provider may prescribe another type of drug (fibrate) or an omega-3 fish oil supplement, or both. Follow these instructions at home:  Eating and drinking  Follow instructions from your health care provider or dietitian about eating or drinking restrictions.  Eat a healthy diet as told by your health care provider. This can help you reach and maintain a healthy weight, lower your LDL cholesterol, and raise your HDL cholesterol. This may include: ? Limiting your calories, if you are overweight. ? Eating more fruits, vegetables, whole grains, fish, and lean meats. ? Limiting saturated fat, trans fat, and cholesterol.  If you drink alcohol: ? Limit how much you use. ? Be aware of how much alcohol is in your drink. In the [place] one drink equals one 12 oz bottle of beer (355 mL), one 5 oz glass of wine (148 mL), or one 1 oz glass of hard liquor (44 mL).  Do not drink alcohol if: ? Your health care provider tells you not to drink. ? You are pregnant, may be pregnant, or are planning to become pregnant. Activity  Get regular exercise. Start an exercise and strength training program as told by your health care provider. Ask your health care provider what activities are safe for you. Your health care provider may recommend: ? 30 minutes of aerobic activity 4-6 days a week. Brisk walking is an example of aerobic activity. ? Strength training 2 days a week. General instructions  Do not use any products that contain nicotine or tobacco, such as  cigarettes, e-cigarettes, and chewing tobacco. If you need help quitting, ask your health care provider.  Take over-the-counter and prescription medicines only as told by your health care provider. This includes supplements.  Keep all follow-up visits as told by your health care provider. Contact a health care provider if:  You are: ? Having trouble sticking to your exercise or diet plan. ? Struggling to quit smoking or control your use of alcohol. Summary  Dyslipidemia often involves a high level of cholesterol or triglycerides, which are types of lipids.  Treatment depends on the type of dyslipidemia that you have and your other risk factors for heart disease and stroke.  For many people, treatment starts with lifestyle changes, such as diet and exercise.  Your health care provider may prescribe medicine to lower lipids. This information is not intended to replace advice given to you by your health care provider. Make sure you discuss any questions you have with your health care provider. Document Revised: 08/21/2017 Document Reviewed: 07/28/2017 Elsevier Patient Education  [place].  High Cholesterol  High cholesterol is a condition in which the blood has high levels of a white, waxy, fat-like substance (cholesterol). The human body needs small amounts of cholesterol. The liver makes all  the cholesterol that the body needs. Extra (excess) cholesterol comes from the food that we eat. Cholesterol is carried from the liver by the blood through the blood vessels. If you have high cholesterol, deposits (plaques) may build up on the walls of your blood vessels (arteries). Plaques make the arteries narrower and stiffer. Cholesterol plaques increase your risk for heart attack and stroke. Work with your health care provider to keep your cholesterol levels in a healthy range. What increases the risk? This condition is more likely to develop in people who:  Eat foods that are high in  animal fat (saturated fat) or cholesterol.  Are overweight.  Are not getting enough exercise.  Have a family history of high cholesterol. What are the signs or symptoms? There are no symptoms of this condition. How is this diagnosed? This condition may be diagnosed from the results of a blood test.  If you are older than age 68, your health care provider may check your cholesterol every 4-6 years.  You may be checked more often if you already have high cholesterol or other risk factors for heart disease. The blood test for cholesterol measures:  "Bad" cholesterol (LDL cholesterol). This is the main type of cholesterol that causes heart disease. The desired level for LDL is less than 100.  "Good" cholesterol (HDL cholesterol). This type helps to protect against heart disease by cleaning the arteries and carrying the LDL away. The desired level for HDL is 60 or higher.  Triglycerides. These are fats that the body can store or burn for energy. The desired number for triglycerides is lower than 150.  Total cholesterol. This is a measure of the total amount of cholesterol in your blood, including LDL cholesterol, HDL cholesterol, and triglycerides. A healthy number is less than 200. How is this treated? This condition is treated with diet changes, lifestyle changes, and medicines. Diet changes  This may include eating more whole grains, fruits, vegetables, nuts, and fish.  This may also include cutting back on red meat and foods that have a lot of added sugar. Lifestyle changes  Changes may include getting at least 40 minutes of aerobic exercise 3 times a week. Aerobic exercises include walking, biking, and swimming. Aerobic exercise along with a healthy diet can help you maintain a healthy weight.  Changes may also include quitting smoking. Medicines  Medicines are usually given if diet and lifestyle changes have failed to reduce your cholesterol to healthy levels.  Your health  care provider may prescribe a statin medicine. Statin medicines have been shown to reduce cholesterol, which can reduce the risk of heart disease. Follow these instructions at home: Eating and drinking If told by your health care provider:  Eat chicken (without skin), fish, veal, shellfish, ground [place] breast, and round or loin cuts of red meat.  Do not eat fried foods or fatty meats, such as hot dogs and salami.  Eat plenty of fruits, such as apples.  Eat plenty of vegetables, such as broccoli, potatoes, and carrots.  Eat beans, peas, and lentils.  Eat grains such as barley, rice, couscous, and bulgur wheat.  Eat pasta without cream sauces.  Use skim or nonfat milk, and eat low-fat or nonfat yogurt and cheeses.  Do not eat or drink whole milk, cream, ice cream, egg yolks, or hard cheeses.  Do not eat stick margarine or tub margarines that contain trans fats (also called partially hydrogenated oils).  Do not eat saturated tropical oils, such as coconut oil and  palm oil.  Do not eat cakes, cookies, crackers, or other baked goods that contain trans fats.  General instructions  Exercise as directed by your health care provider. Increase your activity level with activities such as gardening, walking, and taking the stairs.  Take over-the-counter and prescription medicines only as told by your health care provider.  Do not use any products that contain nicotine or tobacco, such as cigarettes and e-cigarettes. If you need help quitting, ask your health care provider.  Keep all follow-up visits as told by your health care provider. This is important. Contact a health care provider if:  You are struggling to maintain a healthy diet or weight.  You need help to start on an exercise program.  You need help to stop smoking. Get help right away if:  You have chest pain.  You have trouble breathing. This information is not intended to replace advice given to you by your health  care provider. Make sure you discuss any questions you have with your health care provider. Document Revised: 12/30/2016 Document Reviewed: 06/27/2015 Elsevier Patient Education  2020 [employer].  Managing Your Hypertension Hypertension is commonly called high blood pressure. This is when the force of your blood pressing against the walls of your arteries is too strong. Arteries are blood vessels that carry blood from your heart throughout your body. Hypertension forces the heart to work harder to pump blood, and may cause the arteries to become narrow or stiff. Having untreated or uncontrolled hypertension can cause heart attack, stroke, kidney disease, and other problems. What are blood pressure readings? A blood pressure reading consists of a higher number over a lower number. Ideally, your blood pressure should be below 120/80. The first ("top") number is called the systolic pressure. It is a measure of the pressure in your arteries as your heart beats. The second ("bottom") number is called the diastolic pressure. It is a measure of the pressure in your arteries as the heart relaxes. What does my blood pressure reading mean? Blood pressure is classified into four stages. Based on your blood pressure reading, your health care provider may use the following stages to determine what type of treatment you need, if any. Systolic pressure and diastolic pressure are measured in a unit called mm Hg. Normal  Systolic pressure: below 120.  Diastolic pressure: below 80. Elevated  Systolic pressure: 120-129.  Diastolic pressure: below 80. Hypertension stage 1  Systolic pressure: 130-139.  Diastolic pressure: 80-89. Hypertension stage 2  Systolic pressure: 140 or above.  Diastolic pressure: 90 or above. What health risks are associated with hypertension? Managing your hypertension is an important responsibility. Uncontrolled hypertension can lead to:  A heart attack.  A stroke.  A  weakened blood vessel (aneurysm).  Heart failure.  Kidney damage.  Eye damage.  Metabolic syndrome.  Memory and concentration problems. What changes can I make to manage my hypertension? Hypertension can be managed by making lifestyle changes and possibly by taking medicines. Your health care provider will help you make a plan to bring your blood pressure within a normal range. Eating and drinking   Eat a diet that is high in fiber and potassium, and low in salt (sodium), added sugar, and fat. An example eating plan is called the DASH (Dietary Approaches to Stop Hypertension) diet. To eat this way: ? Eat plenty of fresh fruits and vegetables. Try to fill half of your plate at each meal with fruits and vegetables. ? Eat whole grains, such as whole  wheat pasta, brown rice, or whole grain bread. Fill about one quarter of your plate with whole grains. ? Eat low-fat diary products. ? Avoid fatty cuts of meat, processed or cured meats, and poultry with skin. Fill about one quarter of your plate with lean proteins such as fish, chicken without skin, beans, eggs, and tofu. ? Avoid premade and processed foods. These tend to be higher in sodium, added sugar, and fat.  Reduce your daily sodium intake. Most people with hypertension should eat less than 1,500 mg of sodium a day.  Limit alcohol intake to no more than 1 drink a day for nonpregnant women and 2 drinks a day for men. One drink equals 12 oz of beer, 5 oz of wine, or 1 oz of hard liquor. Lifestyle  Work with your health care provider to maintain a healthy body weight, or to lose weight. Ask what an ideal weight is for you.  Get at least 30 minutes of exercise that causes your heart to beat faster (aerobic exercise) most days of the week. Activities may include walking, swimming, or biking.  Include exercise to strengthen your muscles (resistance exercise), such as weight lifting, as part of your weekly exercise routine. Try to do these  types of exercises for 30 minutes at least 3 days a week.  Do not use any products that contain nicotine or tobacco, such as cigarettes and e-cigarettes. If you need help quitting, ask your health care provider.  Control any long-term (chronic) conditions you have, such as high cholesterol or diabetes. Monitoring  Monitor your blood pressure at home as told by your health care provider. Your personal target blood pressure may vary depending on your medical conditions, your age, and other factors.  Have your blood pressure checked regularly, as often as told by your health care provider. Working with your health care provider  Review all the medicines you take with your health care provider because there may be side effects or interactions.  Talk with your health care provider about your diet, exercise habits, and other lifestyle factors that may be contributing to hypertension.  Visit your health care provider regularly. Your health care provider can help you create and adjust your plan for managing hypertension. Will I need medicine to control my blood pressure? Your health care provider may prescribe medicine if lifestyle changes are not enough to get your blood pressure under control, and if:  Your systolic blood pressure is 130 or higher.  Your diastolic blood pressure is 80 or higher. Take medicines only as told by your health care provider. Follow the directions carefully. Blood pressure medicines must be taken as prescribed. The medicine does not work as well when you skip doses. Skipping doses also puts you at risk for problems. Contact a health care provider if:  You think you are having a reaction to medicines you have taken.  You have repeated (recurrent) headaches.  You feel dizzy.  You have swelling in your ankles.  You have trouble with your vision. Get help right away if:  You develop a severe headache or confusion.  You have unusual weakness or numbness, or you  feel faint.  You have severe pain in your chest or abdomen.  You vomit repeatedly.  You have trouble breathing. Summary  Hypertension is when the force of blood pumping through your arteries is too strong. If this condition is not controlled, it may put you at risk for serious complications.  Your personal target blood pressure may vary  depending on your medical conditions, your age, and other factors. For most people, a normal blood pressure is less than 120/80.  Hypertension is managed by lifestyle changes, medicines, or both. Lifestyle changes include weight loss, eating a healthy, low-sodium diet, exercising more, and limiting alcohol. This information is not intended to replace advice given to you by your health care provider. Make sure you discuss any questions you have with your health care provider. Document Revised: 04/20/2018 Document Reviewed: 11/25/2015 Elsevier Patient Education  [place].

## 2019-05-17 ENCOUNTER — Telehealth: Payer: Self-pay | Admitting: Nurse Practitioner

## 2019-05-17 NOTE — Telephone Encounter (Signed)
I spoke with Sean Norman he states the referral is stating Diagnostic which is costing him out of pocket 5,000 and if the referral says preventive his insurance will pay 100%. Sean Norman has a appt on 05/23/2019. Please advise and Thank you!

## 2019-05-17 NOTE — Telephone Encounter (Signed)
Pt called about  his referral to GI. He says his insurance will only pay for preventive or the pt has to pay out of pocket.

## 2019-05-17 NOTE — Telephone Encounter (Signed)
If he had a history of adenomatous colon polyp (s) per colonoscopy in 2014 and was told to repeat in 5 years- 2019, then this is a diagnostic colonoscopy.   A screening colonoscopy would have him back in 10 years and he would have been told to return in 2024.

## 2019-05-21 ENCOUNTER — Other Ambulatory Visit: Admission: RE | Admit: 2019-05-21 | Payer: BC Managed Care – PPO | Source: Ambulatory Visit

## 2019-05-23 ENCOUNTER — Ambulatory Visit
Admission: RE | Admit: 2019-05-23 | Payer: BC Managed Care – PPO | Source: Home / Self Care | Admitting: Gastroenterology

## 2019-05-23 ENCOUNTER — Encounter: Admission: RE | Payer: Self-pay | Source: Home / Self Care

## 2019-05-23 SURGERY — COLONOSCOPY WITH PROPOFOL
Anesthesia: General

## 2019-05-24 NOTE — Telephone Encounter (Signed)
I spoke with pt he cancelled the appt with gastro at this time.

## 2019-06-04 ENCOUNTER — Ambulatory Visit: Payer: BC Managed Care – PPO | Attending: Family

## 2019-06-04 DIAGNOSIS — Z23 Encounter for immunization: Secondary | ICD-10-CM

## 2019-06-04 NOTE — Progress Notes (Signed)
   Covid-[facility] Vaccination [facility]  Name:  Sean Norman    MRN: [id number] DOB: 29-Aug-1955  06/04/2019  [name] was observed post Covid-19 immunization for 15 minutes without incident. He was provided with Vaccine Information Sheet and instruction to access the V-Safe system.   [name] was instructed to call 911 with any severe reactions post vaccine: Marland Kitchen Difficulty breathing  . Swelling of face and throat  . A fast heartbeat  . A bad rash all over body  . Dizziness and weakness   Immunizations Administered    Name Date Dose VIS Date Route   Moderna COVID-19 Vaccine 06/04/2019  3:54 PM 0.5 mL 12/2018 Intramuscular   Manufacturer: Moderna   Lot: [id number]   NDC: 77116-579-03

## 2019-06-17 ENCOUNTER — Other Ambulatory Visit: Payer: BC Managed Care – PPO

## 2019-06-19 ENCOUNTER — Other Ambulatory Visit: Payer: BC Managed Care – PPO

## 2019-06-20 ENCOUNTER — Other Ambulatory Visit (INDEPENDENT_AMBULATORY_CARE_PROVIDER_SITE_OTHER): Payer: BC Managed Care – PPO

## 2019-06-20 ENCOUNTER — Other Ambulatory Visit: Payer: Self-pay

## 2019-06-20 DIAGNOSIS — Z114 Encounter for screening for human immunodeficiency virus [HIV]: Secondary | ICD-10-CM | POA: Diagnosis not present

## 2019-06-20 DIAGNOSIS — Z1159 Encounter for screening for other viral diseases: Secondary | ICD-10-CM | POA: Diagnosis not present

## 2019-06-20 DIAGNOSIS — E785 Hyperlipidemia, unspecified: Secondary | ICD-10-CM

## 2019-06-20 DIAGNOSIS — Z6832 Body mass index (BMI) 32.0-32.9, adult: Secondary | ICD-10-CM

## 2019-06-20 LAB — HEPATIC FUNCTION PANEL
ALT: 18 U/L (ref 0–53)
AST: 15 U/L (ref 0–37)
Albumin: 4.1 g/dL (ref 3.5–5.2)
Alkaline Phosphatase: 93 U/L (ref 39–117)
Bilirubin, Direct: 0.1 mg/dL (ref 0.0–0.3)
Total Bilirubin: 0.4 mg/dL (ref 0.2–1.2)
Total Protein: 7.9 g/dL (ref 6.0–8.3)

## 2019-06-20 LAB — LIPID PANEL
Cholesterol: 197 mg/dL (ref 0–200)
HDL: 47.6 mg/dL (ref >39.00)
LDL Cholesterol: 137 mg/dL — ABNORMAL HIGH (ref 0–99)
NonHDL: 149.48
Total CHOL/HDL Ratio: 4
Triglycerides: 63 mg/dL (ref 0.0–149.0)
VLDL: 12.6 mg/dL (ref 0.0–40.0)

## 2019-06-21 LAB — HEPATITIS C ANTIBODY
Hepatitis C Ab: NONREACTIVE
SIGNAL TO CUT-OFF: 0.25 (ref <1.00)

## 2019-06-21 LAB — HIV ANTIBODY (ROUTINE TESTING W REFLEX): HIV 1&2 Ab, 4th Generation: NONREACTIVE

## 2019-06-21 LAB — HEMOGLOBIN A1C: Hgb A1c MFr Bld: 6.4 % (ref 4.6–6.5)

## 2019-06-24 ENCOUNTER — Telehealth: Payer: Self-pay | Admitting: Nurse Practitioner

## 2019-06-27 NOTE — Telephone Encounter (Signed)
He cancelled his f/up colonoscopy  bec diagnostic instead of screening. He had his last colonoscopy >10 years ago.   Any way for him to get those old [facility] procedure records?

## 2019-06-28 NOTE — Telephone Encounter (Signed)
LMTCB

## 2019-06-28 NOTE — Telephone Encounter (Signed)
Patient forgot about trying to get record of last colonoscopy and states he will try to get a copy before his next visit in July

## 2019-07-18 ENCOUNTER — Encounter: Payer: Self-pay | Admitting: Nurse Practitioner

## 2019-07-18 ENCOUNTER — Encounter: Payer: Self-pay | Admitting: Internal Medicine

## 2019-07-18 ENCOUNTER — Ambulatory Visit: Payer: BC Managed Care – PPO | Admitting: Nurse Practitioner

## 2019-07-18 ENCOUNTER — Other Ambulatory Visit: Payer: Self-pay

## 2019-07-18 VITALS — BP 132/88 | HR 100 | Temp 97.9°F | Ht 72.0 in | Wt 315.0 lb

## 2019-07-18 DIAGNOSIS — I10 Essential (primary) hypertension: Secondary | ICD-10-CM | POA: Diagnosis not present

## 2019-07-18 DIAGNOSIS — Z86010 Personal history of colonic polyps: Secondary | ICD-10-CM | POA: Diagnosis not present

## 2019-07-18 DIAGNOSIS — E785 Hyperlipidemia, unspecified: Secondary | ICD-10-CM | POA: Diagnosis not present

## 2019-07-18 NOTE — Patient Instructions (Addendum)
Continue with healthy lifestyle changes.  We will recheck your blood pressure and cholesterol panel in 3 months.  At that time, if your blood pressure and cholesterol is not at goal, we will need to adjust your medication.  I placed referral to [facility].  Please go to the appointment to discuss your options for colonoscopy.   Hypertension, Adult High blood pressure (hypertension) is when the force of blood pumping through the arteries is too strong. The arteries are the blood vessels that carry blood from the heart throughout the body. Hypertension forces the heart to work harder to pump blood and may cause arteries to become narrow or stiff. Untreated or uncontrolled hypertension can cause a heart attack, heart failure, a stroke, kidney disease, and other problems. A blood pressure reading consists of a higher number over a lower number. Ideally, your blood pressure should be below 120/80. The first ("top") number is called the systolic pressure. It is a measure of the pressure in your arteries as your heart beats. The second ("bottom") number is called the diastolic pressure. It is a measure of the pressure in your arteries as the heart relaxes. What are the causes? The exact cause of this condition is not known. There are some conditions that result in or are related to high blood pressure. What increases the risk? Some risk factors for high blood pressure are under your control. The following factors may make you more likely to develop this condition:  Smoking.  Having type 2 diabetes mellitus, high cholesterol, or both.  Not getting enough exercise or physical activity.  Being overweight.  Having too much fat, sugar, calories, or salt (sodium) in your diet.  Drinking too much alcohol. Some risk factors for high blood pressure may be difficult or impossible to change. Some of these factors include:  Having chronic kidney disease.  Having a family history of high blood  pressure.  Age. Risk increases with age.  Race. You may be at higher risk if you are African American.  Gender. Men are at higher risk than women before age 74. After age 26, women are at higher risk than men.  Having obstructive sleep apnea.  Stress. What are the signs or symptoms? High blood pressure may not cause symptoms. Very high blood pressure (hypertensive crisis) may cause:  Headache.  Anxiety.  Shortness of breath.  Nosebleed.  Nausea and vomiting.  Vision changes.  Severe chest pain.  Seizures. How is this diagnosed? This condition is diagnosed by measuring your blood pressure while you are seated, with your arm resting on a flat surface, your legs uncrossed, and your feet flat on the floor. The cuff of the blood pressure monitor will be placed directly against the skin of your upper arm at the level of your heart. It should be measured at least twice using the same arm. Certain conditions can cause a difference in blood pressure between your right and left arms. Certain factors can cause blood pressure readings to be lower or higher than normal for a short period of time:  When your blood pressure is higher when you are in a health care provider's office than when you are at home, this is called white coat hypertension. Most people with this condition do not need medicines.  When your blood pressure is higher at home than when you are in a health care provider's office, this is called masked hypertension. Most people with this condition may need medicines to control blood pressure. If you have  a high blood pressure reading during one visit or you have normal blood pressure with other risk factors, you may be asked to:  Return on a different day to have your blood pressure checked again.  Monitor your blood pressure at home for 1 week or longer. If you are diagnosed with hypertension, you may have other blood or imaging tests to help your health care provider  understand your overall risk for other conditions. How is this treated? This condition is treated by making healthy lifestyle changes, such as eating healthy foods, exercising more, and reducing your alcohol intake. Your health care provider may prescribe medicine if lifestyle changes are not enough to get your blood pressure under control, and if:  Your systolic blood pressure is above 130.  Your diastolic blood pressure is above 80. Your personal target blood pressure may vary depending on your medical conditions, your age, and other factors. Follow these instructions at home: Eating and drinking   Eat a diet that is high in fiber and potassium, and low in sodium, added sugar, and fat. An example eating plan is called the DASH (Dietary Approaches to Stop Hypertension) diet. To eat this way: ? Eat plenty of fresh fruits and vegetables. Try to fill one half of your plate at each meal with fruits and vegetables. ? Eat whole grains, such as whole-wheat pasta, brown rice, or whole-grain bread. Fill about one fourth of your plate with whole grains. ? Eat or drink low-fat dairy products, such as skim milk or low-fat yogurt. ? Avoid fatty cuts of meat, processed or cured meats, and poultry with skin. Fill about one fourth of your plate with lean proteins, such as fish, chicken without skin, beans, eggs, or tofu. ? Avoid pre-made and processed foods. These tend to be higher in sodium, added sugar, and fat.  Reduce your daily sodium intake. Most people with hypertension should eat less than 1,500 mg of sodium a day.  Do not drink alcohol if: ? Your health care provider tells you not to drink. ? You are pregnant, may be pregnant, or are planning to become pregnant.  If you drink alcohol: ? Limit how much you use to:  0-1 drink a day for women.  0-2 drinks a day for men. ? Be aware of how much alcohol is in your drink. In the [place] one drink equals one 12 oz bottle of beer (355 mL), one 5 oz  glass of wine (148 mL), or one 1 oz glass of hard liquor (44 mL). Lifestyle   Work with your health care provider to maintain a healthy body weight or to lose weight. Ask what an ideal weight is for you.  Get at least 30 minutes of exercise most days of the week. Activities may include walking, swimming, or biking.  Include exercise to strengthen your muscles (resistance exercise), such as Pilates or lifting weights, as part of your weekly exercise routine. Try to do these types of exercises for 30 minutes at least 3 days a week.  Do not use any products that contain nicotine or tobacco, such as cigarettes, e-cigarettes, and chewing tobacco. If you need help quitting, ask your health care provider.  Monitor your blood pressure at home as told by your health care provider.  Keep all follow-up visits as told by your health care provider. This is important. Medicines  Take over-the-counter and prescription medicines only as told by your health care provider. Follow directions carefully. Blood pressure medicines must be taken as prescribed.  Do not skip doses of blood pressure medicine. Doing this puts you at risk for problems and can make the medicine less effective.  Ask your health care provider about side effects or reactions to medicines that you should watch for. Contact a health care provider if you:  Think you are having a reaction to a medicine you are taking.  Have headaches that keep coming back (recurring).  Feel dizzy.  Have swelling in your ankles.  Have trouble with your vision. Get help right away if you:  Develop a severe headache or confusion.  Have unusual weakness or numbness.  Feel faint.  Have severe pain in your chest or abdomen.  Vomit repeatedly.  Have trouble breathing. Summary  Hypertension is when the force of blood pumping through your arteries is too strong. If this condition is not controlled, it may put you at risk for serious  complications.  Your personal target blood pressure may vary depending on your medical conditions, your age, and other factors. For most people, a normal blood pressure is less than 120/80.  Hypertension is treated with lifestyle changes, medicines, or a combination of both. Lifestyle changes include losing weight, eating a healthy, low-sodium diet, exercising more, and limiting alcohol. This information is not intended to replace advice given to you by your health care provider. Make sure you discuss any questions you have with your health care provider. Document Revised: 09/06/2017 Document Reviewed: 09/06/2017 Elsevier Patient Education  2020 [employer]  Dyslipidemia Dyslipidemia is an imbalance of waxy, fat-like substances (lipids) in the blood. The body needs lipids in small amounts. Dyslipidemia often involves a high level of cholesterol or triglycerides, which are types of lipids. Common forms of dyslipidemia include:  High levels of LDL cholesterol. LDL is the type of cholesterol that causes fatty deposits (plaques) to build up in the blood vessels that carry blood away from your heart (arteries).  Low levels of HDL cholesterol. HDL cholesterol is the type of cholesterol that protects against heart disease. High levels of HDL remove the LDL buildup from arteries.  High levels of triglycerides. Triglycerides are a fatty substance in the blood that is linked to a buildup of plaques in the arteries. What are the causes? Primary dyslipidemia is caused by changes (mutations) in genes that are passed down through families (inherited). These mutations cause several types of dyslipidemia. Secondary dyslipidemia is caused by lifestyle choices and diseases that lead to dyslipidemia, such as:  Eating a diet that is high in animal fat.  Not getting enough exercise.  Having diabetes, kidney disease, liver disease, or thyroid disease.  Drinking large amounts of alcohol.  Using certain  medicines. What increases the risk? You are more likely to develop this condition if you are an older man or if you are a woman who has gone through menopause. Other risk factors include:  Having a family history of dyslipidemia.  Taking certain medicines, including birth control pills, steroids, some diuretics, and beta-blockers.  Smoking cigarettes.  Eating a high-fat diet.  Having certain medical conditions such as diabetes, polycystic ovary syndrome (PCOS), kidney disease, liver disease, or hypothyroidism.  Not exercising regularly.  Being overweight or obese with too much belly fat. What are the signs or symptoms? In most cases, dyslipidemia does not usually cause any symptoms. In severe cases, very high lipid levels can cause:  Fatty bumps under the skin (xanthomas).  White or gray ring around the black center (pupil) of the eye. Very high triglyceride levels can cause  inflammation of the pancreas (pancreatitis). How is this diagnosed? Your health care provider may diagnose dyslipidemia based on a routine blood test (fasting blood test). Because most people do not have symptoms of the condition, this blood testing (lipid profile) is done on adults age 23 and older and is repeated every 5 years. This test checks:  Total cholesterol. This measures the total amount of cholesterol in your blood, including LDL cholesterol, HDL cholesterol, and triglycerides. A healthy number is below 200.  LDL cholesterol. The target number for LDL cholesterol is different for each person, depending on individual risk factors. Ask your health care provider what your LDL cholesterol should be.  HDL cholesterol. An HDL level of 60 or higher is best because it helps to protect against heart disease. A number below 40 for men or below 50 for women increases the risk for heart disease.  Triglycerides. A healthy triglyceride number is below 150. If your lipid profile is abnormal, your health care  provider may do other blood tests. How is this treated? Treatment depends on the type of dyslipidemia that you have and your other risk factors for heart disease and stroke. Your health care provider will have a target range for your lipid levels based on this information. For many people, this condition may be treated by lifestyle changes, such as diet and exercise. Your health care provider may recommend that you:  Get regular exercise.  Make changes to your diet.  Quit smoking if you smoke. If diet changes and exercise do not help you reach your goals, your health care provider may also prescribe medicine to lower lipids. The most commonly prescribed type of medicine lowers your LDL cholesterol (statin drug). If you have a high triglyceride level, your provider may prescribe another type of drug (fibrate) or an omega-3 fish oil supplement, or both. Follow these instructions at home:  Eating and drinking  Follow instructions from your health care provider or dietitian about eating or drinking restrictions.  Eat a healthy diet as told by your health care provider. This can help you reach and maintain a healthy weight, lower your LDL cholesterol, and raise your HDL cholesterol. This may include: ? Limiting your calories, if you are overweight. ? Eating more fruits, vegetables, whole grains, fish, and lean meats. ? Limiting saturated fat, trans fat, and cholesterol.  If you drink alcohol: ? Limit how much you use. ? Be aware of how much alcohol is in your drink. In the [place] one drink equals one 12 oz bottle of beer (355 mL), one 5 oz glass of wine (148 mL), or one 1 oz glass of hard liquor (44 mL).  Do not drink alcohol if: ? Your health care provider tells you not to drink. ? You are pregnant, may be pregnant, or are planning to become pregnant. Activity  Get regular exercise. Start an exercise and strength training program as told by your health care provider. Ask your health care  provider what activities are safe for you. Your health care provider may recommend: ? 30 minutes of aerobic activity 4-6 days a week. Brisk walking is an example of aerobic activity. ? Strength training 2 days a week. General instructions  Do not use any products that contain nicotine or tobacco, such as cigarettes, e-cigarettes, and chewing tobacco. If you need help quitting, ask your health care provider.  Take over-the-counter and prescription medicines only as told by your health care provider. This includes supplements.  Keep all follow-up visits as told  by your health care provider. Contact a health care provider if:  You are: ? Having trouble sticking to your exercise or diet plan. ? Struggling to quit smoking or control your use of alcohol. Summary  Dyslipidemia often involves a high level of cholesterol or triglycerides, which are types of lipids.  Treatment depends on the type of dyslipidemia that you have and your other risk factors for heart disease and stroke.  For many people, treatment starts with lifestyle changes, such as diet and exercise.  Your health care provider may prescribe medicine to lower lipids. This information is not intended to replace advice given to you by your health care provider. Make sure you discuss any questions you have with your health care provider. Document Revised: 08/21/2017 Document Reviewed: 07/28/2017 Elsevier Patient Education  2020 [employer].  Colorectal Cancer Screening  Colorectal cancer screening is a group of tests that are used to check for colorectal cancer before symptoms develop. Colorectal refers to the colon and rectum. The colon and rectum are located at the end of the digestive tract and carry bowel movements out of the body. Who should have screening? All adults starting at age 3 until age 55 should have screening. Your health care provider may recommend screening at age 39. You will have tests every 1-10 years,  depending on your results and the type of screening test. You may have screening tests starting at an earlier age, or more frequently than other people, if you have any of the following risk factors:  A personal or family history of colorectal cancer or abnormal growths (polyps).  Inflammatory bowel disease, such as ulcerative colitis or Crohn's disease.  A history of having radiation treatment to the abdomen or pelvic area for cancer.  Colorectal cancer symptoms, such as changes in bowel habits or blood in your stool.  A type of colon cancer syndrome that is passed from parent to child (hereditary), such as: ? Lynch syndrome. ? Familial adenomatous polyposis. ? Turcot syndrome. ? Peutz-Jeghers syndrome. Screening recommendations for adults who are 60-60 years old vary depending on health. How is screening done? There are several types of colorectal screening tests. You may have one or more of the following:  Guaiac-based fecal occult blood testing. For this test, a stool (feces) sample is checked for hidden (occult) blood, which could be a sign of colorectal cancer.  Fecal immunochemical test (FIT). For this test, a stool sample is checked for blood, which could be a sign of colorectal cancer.  Stool DNA test. For this test, a stool sample is checked for blood and changes in DNA that could lead to colorectal cancer.  Sigmoidoscopy. During this test, a thin, flexible tube with a camera on the end (sigmoidoscope) is used to examine the rectum and the lower colon.  Colonoscopy. During this test, a long, flexible tube with a camera on the end (colonoscope) is used to examine the entire colon and rectum. With a colonoscopy, it is possible to take a sample of tissue (biopsy) and remove small polyps during the test.  Virtual colonoscopy. Instead of a colonoscope, this type of colonoscopy uses X-rays (CT scan) and computers to produce images of the colon and rectum. What are the benefits of  screening? Screening reduces your risk for colorectal cancer and can help identify cancer at an early stage, when the cancer can be removed or treated more easily. It is common for polyps to form in the lining of the colon, especially as you age. These  polyps may be cancerous or become cancerous over time. Screening can identify these polyps. What are the risks of screening? Each screening test may have different risks.  Stool sample tests have fewer risks than other types of screening tests. However, you may need more tests to confirm results from a stool sample test.  Screening tests that involve X-rays expose you to low levels of radiation, which may slightly increase your cancer risk. The benefit of detecting cancer outweighs the slight increase in risk.  Screening tests such as sigmoidoscopy and colonoscopy may place you at risk for bleeding, intestinal damage, infection, or a reaction to medicines given during the exam. Talk with your health care provider to understand your risk for colorectal cancer and to make a screening plan that is right for you. Questions to ask your health care provider  When should I start colorectal cancer screening?  What is my risk for colorectal cancer?  How often do I need screening?  Which screening tests do I need?  How do I get my test results?  What do my results mean? Where to find more information Learn more about colorectal cancer screening from:  The American Cancer Society: www.cancer.org  The [employer]: www.cancer.gov Summary  Colorectal cancer screening is a group of tests used to check for colorectal cancer before symptoms develop.  Screening reduces your risk for colorectal cancer and can help identify cancer at an early stage, when the cancer can be removed or treated more easily.  All adults starting at age 28 until age 75 should have screening. Your health care provider may recommend screening at age 7.  You  may have screening tests starting at an earlier age, or more frequently than other people, if you have certain risk factors.  Talk with your health care provider to understand your risk for colorectal cancer and to make a screening plan that is right for you. This information is not intended to replace advice given to you by your health care provider. Make sure you discuss any questions you have with your health care provider. Document Revised: 04/18/2018 Document Reviewed: 09/28/2016 Elsevier Patient Education  2020 [employer]. Continue with current lifestyle recommendations that we have discussed.  We went over diet, exercise and patient is active at the gym.  I have placed a referral into the [facility].  Patient is overdue for colonoscopy for history of colon polyps.  Follow-up office visit in 3 months.  Patient agrees that if his blood pressure, lipids, A1c is not at goal at that time, he will agree to additional medication.

## 2019-07-18 NOTE — Progress Notes (Signed)
Established Patient Office Visit  Subjective:  Patient ID: Sean Norman, male    DOB: [birth year]  Age: 64 y.o. MRN: [id number]  CC:  Chief Complaint  Patient presents with  . Follow-up    HPI Sean Norman is a 64 year old patient with history of HTN, HLD, personal history of colon polyps overdue for colonoscopy, vitiligo, obesity returns for 54-month follow-up.  Essential hypertension: Not at goal, patient presents on amlodipine 10 mg daily.  He is resistant to adding over-the-counter medications and choosing to work with diet lifestyle.  Blood pressure has markedly improved on therapy over the last 3 months, but reports at home it runs about 147/80.  He feels well, without edema, chest pain, shortness of breath or DOE.  BP Readings from Last 3 Encounters:  07/18/19 132/88  05/16/19 130/80  05/02/19 (!) 160/102   HLD, BMI 42, obesity, pre diabetes: Diet and exercise.  Previously ordered Crestor 10 mg and lipids improved. He is no longer taking it.  He reports Crestor gave him constipation.  He is taking Crestor 2 or 3 tablets a week.  Patient does not want to take medication.  Personal history of colon polyps.  Previous colonoscopy was done at the [place] greater than 10 years ago showed polyps.  And he thinks he was told to come back in 5 years but he really cannot find the report does not know for sure.  Patient looked into getting a colonoscopy and was told it would be $5000 out-of-pocket.  He canceled the procedure.  He saw Dr. Johnney Killian group.  We discussed that colonoscopy should not cost $5000 we will look into this again.   Immunizations: Declines any immunizations today including Covid, tetanus, Shingrix.  Past Medical History:  Diagnosis Date  . Allergic rhinitis 05/02/2019  . Chicken pox   . Hypertension   . Vitiligo     Past Surgical History:  Procedure Laterality Date  . NO PAST SURGERIES      Family History  Problem Relation Age of Onset  . Heart disease Mother   .  Hypertension Mother   . Kidney disease Mother   . Diabetes Mother   . Heart disease Father   . Stroke Father   . Hypertension Father   . Breast cancer Maternal Grandmother   . Hypertension Maternal Grandmother   . Heart disease Maternal Grandmother   . Hypertension Maternal Grandfather   . Stroke Maternal Grandfather   . Heart disease Maternal Grandfather   . Diabetes Paternal Grandfather     Social History   Socioeconomic History  . Marital status: Married    Spouse name: Not on file  . Number of children: Not on file  . Years of education: Not on file  . Highest education level: Not on file  Occupational History  . Not on file  Tobacco Use  . Smoking status: Never Smoker  . Smokeless tobacco: Never Used  Substance and Sexual Activity  . Alcohol use: No  . Drug use: No  . Sexual activity: Not on file  Other Topics Concern  . Not on file  Social History Narrative  . Not on file   Social Determinants of Health   Financial Resource Strain:   . Difficulty of Paying Living Expenses:   Food Insecurity:   . Worried About Programme researcher, broadcasting/film/video in the Last Year:   . Barista in the Last Year:   Transportation Needs:   . Freight forwarder (Medical):   Marland Kitchen  Lack of Transportation (Non-Medical):   Physical Activity:   . Days of Exercise per Week:   . Minutes of Exercise per Session:   Stress:   . Feeling of Stress :   Social Connections:   . Frequency of Communication with Friends and Family:   . Frequency of Social Gatherings with Friends and Family:   . Attends Religious Services:   . Active Member of Clubs or Organizations:   . Attends Banker Meetings:   Marland Kitchen Marital Status:   Intimate Partner Violence:   . Fear of Current or Ex-Partner:   . Emotionally Abused:   Marland Kitchen Physically Abused:   . Sexually Abused:     Outpatient Medications Prior to Visit  Medication Sig Dispense Refill  . amLODipine (NORVASC) 10 MG tablet Take 1 tablet (10 mg  total) by mouth daily. 90 tablet 3  . hydrochlorothiazide (HYDRODIURIL) 25 MG tablet Take 1 tablet (25 mg total) by mouth daily. 90 tablet 3  . rosuvastatin (CRESTOR) 10 MG tablet Take 1 tablet (10 mg total) by mouth daily. 90 tablet 0  . mometasone (ELOCON) 0.1 % cream Apply 1 application topically daily. (Patient not taking: Reported on 07/18/2019)    . mometasone-formoterol (DULERA) 100-5 MCG/ACT AERO Inhale 2 puffs into the lungs 2 (two) times daily. (Patient not taking: Reported on 07/18/2019)    . pimecrolimus (ELIDEL) 1 % cream Apply topically 2 (two) times daily. (Patient not taking: Reported on 07/18/2019)     No facility-administered medications prior to visit.    No Known Allergies  Review of Systems  Constitutional: Negative for fever.  HENT: Negative.   Eyes: Negative.   Respiratory: Negative.   Cardiovascular: Negative.   Gastrointestinal: Negative.   Endocrine: Negative.   Genitourinary: Negative.   Musculoskeletal: Negative.   Neurological: Negative.   Psychiatric/Behavioral: Negative.       Objective:    Physical Exam Vitals reviewed.  Constitutional:      Appearance: Normal appearance.  Eyes:     Pupils: Pupils are equal, round, and reactive to light.  Cardiovascular:     Rate and Rhythm: Normal rate and regular rhythm.     Pulses: Normal pulses.     Heart sounds: Normal heart sounds.  Pulmonary:     Effort: Pulmonary effort is normal.     Breath sounds: Normal breath sounds.  Musculoskeletal:        General: Normal range of motion.     Cervical back: Normal range of motion and neck supple.  Skin:    General: Skin is warm and dry.  Neurological:     General: No focal deficit present.     Mental Status: He is alert and oriented to person, place, and time.  Psychiatric:        Mood and Affect: Mood normal.        Behavior: Behavior normal.     BP 132/88 (BP Location: Left Arm, Patient Position: Sitting, Cuff Size: Normal)   Pulse 100   Temp 97.9 F  (36.6 C) (Oral)   Ht 6' (1.829 m)   Wt (!) 315 lb (142.9 kg)   SpO2 97%   BMI 42.72 kg/m  Wt Readings from Last 3 Encounters:  07/18/19 (!) 315 lb (142.9 kg)  05/16/19 257 lb (116.6 kg)  05/02/19 238 lb (108 kg)     There are no preventive care reminders to display for this patient.  There are no preventive care reminders to display for this patient.  Lab  Results  Component Value Date   TSH 2.69 05/02/2019   Lab Results  Component Value Date   WBC 4.9 05/02/2019   HGB 14.2 05/02/2019   HCT 42.4 05/02/2019   MCV 91.4 05/02/2019   PLT 259.0 05/02/2019   Lab Results  Component Value Date   NA 137 05/02/2019   K 4.1 05/02/2019   CO2 31 05/02/2019   GLUCOSE 91 05/02/2019   BUN 18 05/02/2019   CREATININE 0.97 05/02/2019   BILITOT 0.4 06/20/2019   ALKPHOS 93 06/20/2019   AST 15 06/20/2019   ALT 18 06/20/2019   PROT 7.9 06/20/2019   ALBUMIN 4.1 06/20/2019   CALCIUM 9.5 05/02/2019   GFR 94.69 05/02/2019   Lab Results  Component Value Date   CHOL 197 06/20/2019   Lab Results  Component Value Date   HDL 47.60 06/20/2019   Lab Results  Component Value Date   LDLCALC 137 (H) 06/20/2019   Lab Results  Component Value Date   TRIG 63.0 06/20/2019   Lab Results  Component Value Date   CHOLHDL 4 06/20/2019   Lab Results  Component Value Date   HGBA1C 6.4 06/20/2019      Assessment & Plan:   Problem List Items Addressed This Visit      Other   History of colon polyps - Primary   Relevant Orders   Ambulatory referral to Gastroenterology      No orders of the defined types were placed in this encounter. Continue with healthy lifestyle changes.  We will recheck your blood pressure and cholesterol panel in 3 months.  At that time, if your blood pressure and cholesterol is not at goal, we will need to adjust your medication.  I placed referral to [facility].  Please go to the appointment to discuss your options for colonoscopy.  Follow-up: No  follow-ups on file.   This visit occurred during the SARS-CoV-2 public health emergency.  Safety protocols were in place, including screening questions prior to the visit, additional usage of staff PPE, and extensive cleaning of exam room while observing appropriate contact time as indicated for disinfecting solutions.   Amedeo Kinsman, NP

## 2019-07-19 ENCOUNTER — Other Ambulatory Visit: Payer: Self-pay

## 2019-07-19 DIAGNOSIS — I10 Essential (primary) hypertension: Secondary | ICD-10-CM

## 2019-07-19 MED ORDER — HYDROCHLOROTHIAZIDE 25 MG PO TABS: 25.0000 mg | ORAL_TABLET | Freq: Every day | ORAL | 3 refills | Status: DC

## 2019-07-21 ENCOUNTER — Encounter: Payer: Self-pay | Admitting: Nurse Practitioner

## 2019-08-17 ENCOUNTER — Other Ambulatory Visit: Payer: Self-pay | Admitting: Nurse Practitioner

## 2019-09-06 ENCOUNTER — Ambulatory Visit (AMBULATORY_SURGERY_CENTER): Payer: Self-pay | Admitting: *Deleted

## 2019-09-06 ENCOUNTER — Telehealth: Payer: Self-pay | Admitting: *Deleted

## 2019-09-06 ENCOUNTER — Other Ambulatory Visit: Payer: Self-pay

## 2019-09-06 VITALS — Ht 75.0 in | Wt 311.6 lb

## 2019-09-06 DIAGNOSIS — Z86010 Personal history of colonic polyps: Secondary | ICD-10-CM

## 2019-09-06 MED ORDER — NA SULFATE-K SULFATE-MG SULF 17.5-3.13-1.6 GM/177ML PO SOLN: ORAL | 0 refills | Status: DC

## 2019-09-06 NOTE — Telephone Encounter (Signed)
Patient had PV today, he states his last colon was 2014 in [place] ,[place] had a polyp per pt. Patient signed release form and I faxed that to the GI office in [place]. Patient is for a colonoscopy with Dr.Pyrtle on 09/20/2019. No family hx colon cancer per pt.  Release form given to [facility].

## 2019-09-06 NOTE — Progress Notes (Signed)
Patient is here in-person for PV. Patient denies any allergies to eggs or soy. Patient denies any problems with anesthesia/sedation. Patient denies any oxygen use at home. Patient denies taking any diet/weight loss medications or blood thinners. Patient is not being treated for MRSA or C-diff. Patient is aware of our care-partner policy and Covid-19 safety protocol. Release form faxed. COVID-19 vaccines completed per pt.  Prep Prescription coupon given to the patient.

## 2019-09-06 NOTE — Telephone Encounter (Signed)
5 pages of records received from [facility] [address]. These records have been placed on Dr Lauro Franklin desk for review.

## 2019-09-20 ENCOUNTER — Encounter: Payer: BC Managed Care – PPO | Admitting: Internal Medicine

## 2019-10-18 ENCOUNTER — Ambulatory Visit: Payer: BC Managed Care – PPO | Admitting: Nurse Practitioner

## 2019-11-17 ENCOUNTER — Other Ambulatory Visit: Payer: Self-pay | Admitting: Nurse Practitioner

## 2020-01-17 ENCOUNTER — Telehealth: Payer: Self-pay | Admitting: Nurse Practitioner

## 2020-01-17 ENCOUNTER — Other Ambulatory Visit: Payer: Self-pay

## 2020-01-17 DIAGNOSIS — I10 Essential (primary) hypertension: Secondary | ICD-10-CM

## 2020-01-17 MED ORDER — AMLODIPINE BESYLATE 10 MG PO TABS: 10.0000 mg | ORAL_TABLET | Freq: Every day | ORAL | 3 refills | Status: DC

## 2020-01-17 NOTE — Telephone Encounter (Signed)
Medication was sent to pharmacy.  Sean Norman,cma  °

## 2020-01-17 NOTE — Telephone Encounter (Signed)
Pt needs a refill on amLODipine (NORVASC) 10 MG tablet sent to [facility]  Pt stated that his medication was on his truck and it blow off He was a Sports administrator patient and was given [facility] info to est

## 2020-08-19 DIAGNOSIS — Z20822 Contact with and (suspected) exposure to covid-19: Secondary | ICD-10-CM | POA: Diagnosis not present

## 2020-09-01 ENCOUNTER — Other Ambulatory Visit: Payer: Self-pay

## 2020-09-01 ENCOUNTER — Other Ambulatory Visit (HOSPITAL_COMMUNITY): Payer: Self-pay | Admitting: Specialist

## 2020-09-01 ENCOUNTER — Ambulatory Visit (HOSPITAL_COMMUNITY)
Admission: RE | Admit: 2020-09-01 | Discharge: 2020-09-01 | Disposition: A | Discharge status: Home / Self Care | Payer: BC Managed Care – PPO | Source: Ambulatory Visit | Attending: Cardiovascular Disease | Admitting: Cardiovascular Disease

## 2020-09-01 DIAGNOSIS — M79605 Pain in left leg: Secondary | ICD-10-CM | POA: Diagnosis not present

## 2020-09-01 DIAGNOSIS — M7989 Other specified soft tissue disorders: Secondary | ICD-10-CM | POA: Diagnosis not present

## 2020-11-18 DIAGNOSIS — J101 Influenza due to other identified influenza virus with other respiratory manifestations: Secondary | ICD-10-CM | POA: Diagnosis not present

## 2020-11-18 DIAGNOSIS — R0603 Acute respiratory distress: Secondary | ICD-10-CM | POA: Diagnosis not present

## 2020-11-18 DIAGNOSIS — J189 Pneumonia, unspecified organism: Secondary | ICD-10-CM | POA: Diagnosis not present

## 2020-11-18 DIAGNOSIS — U071 COVID-19: Secondary | ICD-10-CM | POA: Diagnosis not present

## 2020-11-23 DIAGNOSIS — Z8701 Personal history of pneumonia (recurrent): Secondary | ICD-10-CM | POA: Diagnosis not present

## 2020-11-23 DIAGNOSIS — Z03818 Encounter for observation for suspected exposure to other biological agents ruled out: Secondary | ICD-10-CM | POA: Diagnosis not present

## 2020-11-23 DIAGNOSIS — Z8616 Personal history of COVID-19: Secondary | ICD-10-CM | POA: Diagnosis not present

## 2021-01-31 ENCOUNTER — Other Ambulatory Visit: Payer: Self-pay | Admitting: Family Medicine

## 2021-01-31 DIAGNOSIS — I10 Essential (primary) hypertension: Secondary | ICD-10-CM

## 2021-02-17 ENCOUNTER — Encounter: Payer: BC Managed Care – PPO | Admitting: Adult Health

## 2021-02-18 DIAGNOSIS — Z13220 Encounter for screening for lipoid disorders: Secondary | ICD-10-CM | POA: Diagnosis not present

## 2021-02-18 DIAGNOSIS — M25562 Pain in left knee: Secondary | ICD-10-CM | POA: Diagnosis not present

## 2021-02-18 DIAGNOSIS — I10 Essential (primary) hypertension: Secondary | ICD-10-CM | POA: Diagnosis not present

## 2021-03-17 DIAGNOSIS — Z111 Encounter for screening for respiratory tuberculosis: Secondary | ICD-10-CM | POA: Diagnosis not present

## 2021-03-17 DIAGNOSIS — Z0000 Encounter for general adult medical examination without abnormal findings: Secondary | ICD-10-CM | POA: Diagnosis not present

## 2021-03-17 DIAGNOSIS — Z125 Encounter for screening for malignant neoplasm of prostate: Secondary | ICD-10-CM | POA: Diagnosis not present

## 2021-03-17 DIAGNOSIS — E785 Hyperlipidemia, unspecified: Secondary | ICD-10-CM | POA: Diagnosis not present

## 2021-03-31 ENCOUNTER — Other Ambulatory Visit: Payer: Self-pay

## 2021-03-31 ENCOUNTER — Ambulatory Visit: Payer: BC Managed Care – PPO | Admitting: Cardiology

## 2021-03-31 ENCOUNTER — Encounter: Payer: Self-pay | Admitting: Cardiology

## 2021-03-31 VITALS — BP 122/81 | HR 90 | Temp 96.7°F | Resp 16 | Ht 76.0 in | Wt 320.0 lb

## 2021-03-31 DIAGNOSIS — I517 Cardiomegaly: Secondary | ICD-10-CM

## 2021-03-31 DIAGNOSIS — Z6838 Body mass index (BMI) 38.0-38.9, adult: Secondary | ICD-10-CM | POA: Diagnosis not present

## 2021-03-31 DIAGNOSIS — E7800 Pure hypercholesterolemia, unspecified: Secondary | ICD-10-CM | POA: Diagnosis not present

## 2021-03-31 DIAGNOSIS — E6609 Other obesity due to excess calories: Secondary | ICD-10-CM | POA: Diagnosis not present

## 2021-03-31 DIAGNOSIS — I10 Essential (primary) hypertension: Secondary | ICD-10-CM

## 2021-03-31 MED ORDER — ATORVASTATIN CALCIUM 40 MG PO TABS: 40.0000 mg | ORAL_TABLET | Freq: Every evening | ORAL | 0 refills | Status: AC

## 2021-03-31 MED ORDER — LOSARTAN POTASSIUM 25 MG PO TABS: 25.0000 mg | ORAL_TABLET | Freq: Every evening | ORAL | 0 refills | Status: DC

## 2021-03-31 NOTE — Progress Notes (Signed)
? ?Date:  03/31/2021  ? ?ID:  Sabien Umland, DOB 1955-02-11, MRN [id number] ? ?PCP:  Marval Regal, NP  ?Cardiologist:  Rex Kras, DO, [facility] (established care March 31, 2021) ? ? ?REASON FOR CONSULT: Hypertensive heart disease without congestive heart failure ? ?REQUESTING PHYSICIAN:  ?Marval Regal, NP ?[place],  [place] [address] ? ?Chief Complaint  ?Patient presents with  ? Hypertension  ? New Patient (Initial Visit)  ? ? ?HPI  ?Moses Odoherty is a 66 y.o. African-American male who presents to the office with a chief complaint of "establish care and abnormal ekg." Patient's past medical history and cardiovascular risk factors include: Hypercholesterolemia, hypertension, family history of CAD/CVA, vitiligo, obesity due to excess calories.  ? ?He is referred to the office at the request of Marval Regal, NP for evaluation of hypertensive heart disease. ? ?Patient recently establish care with PCP and during his well visit an EKG performed that was concerning for left ventricular hypertrophy and possible old infarction and given his other cardiovascular risk factors was referred to cardiology for further evaluation and management for hypertensive heart disease without CHF. ? ?Patient states that his home blood pressures are very well controlled on current medical therapy with SBP around 120-130 mmHg. ? ?He denies any anginal discomfort or heart failure symptoms.  He had a stress test approximately 20 years ago at an outside facility. ? ?FUNCTIONAL STATUS: ?Tries to walk 12 acres of his property twice a week. ? ?ALLERGIES: ?No Known Allergies ? ?MEDICATION LIST PRIOR TO VISIT: ?Current Meds  ?Medication Sig  ? amLODipine (NORVASC) 10 MG tablet Take 1 tablet (10 mg total) by mouth daily.  ? hydrochlorothiazide (HYDRODIURIL) 25 MG tablet Take 1 tablet (25 mg total) by mouth daily.  ? losartan (COZAAR) 25 MG tablet Take 1 tablet (25 mg total) by mouth at bedtime.  ? [DISCONTINUED] amLODipine  (NORVASC) 10 MG tablet Take 1 tablet by mouth daily.  ? [DISCONTINUED] atorvastatin (LIPITOR) 20 MG tablet Take 20 mg by mouth daily.  ? [DISCONTINUED] metoprolol succinate (TOPROL-XL) 25 MG 24 hr tablet Take 25 mg by mouth daily.  ?  ? ?PAST MEDICAL HISTORY: ?Past Medical History:  ?Diagnosis Date  ? Allergic rhinitis 05/02/2019  ? Chicken pox   ? Hyperlipidemia   ? Hypertension   ? Vitiligo   ? ? ?PAST SURGICAL HISTORY: ?Past Surgical History:  ?Procedure Laterality Date  ? COLONOSCOPY  2014  ? in [place]-polyp per pt  ? NO PAST SURGERIES    ? POLYPECTOMY    ? ? ?FAMILY HISTORY: ?The patient family history includes Breast cancer in his maternal grandmother; Diabetes in his mother and paternal grandfather; Heart disease in his father, maternal grandfather, maternal grandmother, and mother; Hypertension in his father, maternal grandfather, maternal grandmother, and mother; Kidney disease in his mother; Stroke in his father and maternal grandfather. ? ?SOCIAL HISTORY:  ?The patient  reports that he has never smoked. He has never used smokeless tobacco. He reports that he does not drink alcohol and does not use drugs. ? ?REVIEW OF SYSTEMS: ?Review of Systems  ?Cardiovascular:  Negative for chest pain, dyspnea on exertion, leg swelling, palpitations and syncope.  ?Respiratory:  Negative for shortness of breath.   ? ?PHYSICAL EXAM: ? ?  03/31/2021  ? 10:47 AM 09/06/2019  ? 10:43 AM 07/18/2019  ? 11:04 AM  ?Vitals with BMI  ?Height 6' 4" 6' 3" 6' 0"  ?Weight 320 lbs 311 lbs 10 oz 315 lbs  ?  BMI 38.97 38.95 42.71  ?Systolic 696  295  ?Diastolic 81  88  ?Pulse 90  100  ? ? ?CONSTITUTIONAL: Well-developed and well-nourished. No acute distress.  ?SKIN: Skin is warm and dry. No rash noted. No cyanosis. No pallor. No jaundice.  vitiligo. ?HEAD: Normocephalic and atraumatic.  ?EYES: No scleral icterus ?MOUTH/THROAT: Moist oral membranes.  ?NECK: No JVD present. No thyromegaly noted. No carotid bruits  ?LYMPHATIC: No visible  cervical adenopathy.  ?CHEST Normal respiratory effort. No intercostal retractions  ?LUNGS: Clear to auscultation bilaterally.  No stridor. No wheezes. No rales.  ?CARDIOVASCULAR: Regular rate and rhythm, positive S1-S2, no murmurs rubs or gallops appreciated. ?ABDOMINAL: Soft, nontender, nondistended, positive bowel sounds in all 4 quadrants, no apparent ascites.  ?EXTREMITIES: No peripheral edema, warm to touch, 2+ bilateral DP and PT pulses ?HEMATOLOGIC: No significant bruising ?NEUROLOGIC: Oriented to person, place, and time. Nonfocal. Normal muscle tone.  ?PSYCHIATRIC: Normal mood and affect. Normal behavior. Cooperative ? ?CARDIAC DATABASE: ?EKG: ?03/31/2021: NSR, 77 bpm, poor R wave progression, LVH with voltage criteria. ? ?Echocardiogram: ?No results found for this or any previous visit from the past 1095 days. ?  ? ?Stress Testing: ?No results found for this or any previous visit from the past 1095 days. ? ? ?Heart Catheterization: ?None ? ?LABORATORY DATA: ? ?External Labs: ?Collected: February 18, 2021. ?Total cholesterol 233, triglycerides 99, HDL 45, LDL 171, non-HDL 189 ? ? ?Collected: March 17, 2021 provided by referring physician. ?Total cholesterol 174, triglycerides 72, HDL 41, LDL 120, non-HDL 134 ?Hemoglobin 14.3 g/dL, hematocrit 42.8% ?BUN 15, creatinine 1.04. ?Sodium 140, potassium 3.7, chloride 100, bicarb 34. ?AST 15, ALT 19, alkaline phosphatase 113 ? ? ?IMPRESSION: ? ?  ICD-10-CM   ?1. Benign hypertension  I10 EKG 12-Lead  ?  PCV ECHOCARDIOGRAM COMPLETE  ?  ?2. LVH (left ventricular hypertrophy)  I51.7 PCV ECHOCARDIOGRAM COMPLETE  ?  ?3. Pure hypercholesterolemia  E78.00 CT CARDIAC SCORING ([facility] LOCATIONS ONLY)  ?  atorvastatin (LIPITOR) 40 MG tablet  ?  Lipid Panel With LDL/HDL Ratio  ?  LDL cholesterol, direct  ?  CMP14+EGFR  ?  ?4. Class 2 obesity due to excess calories without serious comorbidity with body mass index (BMI) of 38.0 to 38.9 in adult  E66.09   ? M84.13   ?  ?   ? ?RECOMMENDATIONS: ?Winnie Barsky is a 67 y.o. African-American male whose past medical history and cardiac risk factors include: Hypercholesterolemia, hypertension, family history of CAD/CVA, vitiligo, obesity due to excess calories.  ? ?Patient was referred to the practice for hypertensive heart disease without congestive heart failure.  He carries a long history of hypertension and based on his current medical regimen his blood pressures are very well controlled.  He is currently taking metoprolol which is not an ideal blood pressure medication and therefore recommending transitioning to losartan 25 mg p.o. daily. ? ?He takes majority of his medications all at once.  I have asked him to take a few in the morning few at night details given to him. ? ?Plan echocardiogram to evaluate for structural heart disease, LVEF, and valvular heart disease. ? ?Patient is also noted to have hypercholesterolemia which has not well controlled.  His initial LDL was 171 mg/dL and most recent 120 mg/dL.  I have asked him to increase Lipitor to 40 mg p.o. nightly.  If he tolerates this medication well without any side effects or intolerance of a new prescription will be provided.  We will also check a  coronary calcium score for further risk stratification.  Depending on the results of the CAC will determine the modality of stress testing. ? ?Educated importance of heart healthy diet, increasing physical activity with a goal of 30 minutes a day 5 days a week, consuming foods that are low in cholesterol, and glycemic control. ? ?As part of today's office visit reviewed outside records provided by PCP including office notes and external labs via [facility].  Ordered and independently reviewed today's EKG as noted above.  I have ordered additional diagnostic work-up as discussed above.  Patient's questions and concerns addressed to his satisfaction. ? ? ?FINAL MEDICATION LIST END OF ENCOUNTER: ?Meds ordered this encounter   ?Medications  ? losartan (COZAAR) 25 MG tablet  ?  Sig: Take 1 tablet (25 mg total) by mouth at bedtime.  ?  Dispense:  90 tablet  ?  Refill:  0  ? atorvastatin (LIPITOR) 40 MG tablet  ?  Sig: Take 1 tablet (40 mg total) by mouth a

## 2021-04-12 ENCOUNTER — Other Ambulatory Visit: Payer: Self-pay

## 2021-04-12 DIAGNOSIS — E7800 Pure hypercholesterolemia, unspecified: Secondary | ICD-10-CM

## 2021-04-13 ENCOUNTER — Ambulatory Visit: Payer: BC Managed Care – PPO

## 2021-04-13 DIAGNOSIS — I10 Essential (primary) hypertension: Secondary | ICD-10-CM

## 2021-04-13 DIAGNOSIS — I517 Cardiomegaly: Secondary | ICD-10-CM | POA: Diagnosis not present

## 2021-04-15 NOTE — Progress Notes (Signed)
Called pt no answer, could not leave a vm.

## 2021-04-20 NOTE — Progress Notes (Signed)
Attempted to call pt, no answer. Unable to leave vm requesting call back

## 2021-04-21 NOTE — Progress Notes (Signed)
Called and spoke to pt, pt voiced understanding. Was unable to transfer pt to get the pts coronary CTA and appt moved to a sooner date. Will have Devonna call pt tomorrow.

## 2021-04-26 DIAGNOSIS — E7800 Pure hypercholesterolemia, unspecified: Secondary | ICD-10-CM | POA: Diagnosis not present

## 2021-04-27 LAB — CMP14+EGFR
ALT: 19 IU/L (ref 0–44)
AST: 14 IU/L (ref 0–40)
Albumin/Globulin Ratio: 1.4 (ref 1.2–2.2)
Albumin: 4.2 g/dL (ref 3.8–4.8)
Alkaline Phosphatase: 126 IU/L — ABNORMAL HIGH (ref 44–121)
BUN/Creatinine Ratio: 17 (ref 10–24)
BUN: 17 mg/dL (ref 8–27)
Bilirubin Total: 0.5 mg/dL (ref 0.0–1.2)
CO2: 27 mmol/L (ref 20–29)
Calcium: 9.4 mg/dL (ref 8.6–10.2)
Chloride: 104 mmol/L (ref 96–106)
Creatinine, Ser: 1.01 mg/dL (ref 0.76–1.27)
Globulin, Total: 3 g/dL (ref 1.5–4.5)
Glucose: 97 mg/dL (ref 70–99)
Potassium: 4.2 mmol/L (ref 3.5–5.2)
Sodium: 147 mmol/L — ABNORMAL HIGH (ref 134–144)
Total Protein: 7.2 g/dL (ref 6.0–8.5)
eGFR: 83 mL/min/{1.73_m2} (ref >59)

## 2021-04-27 LAB — LIPID PANEL WITH LDL/HDL RATIO
Cholesterol, Total: 142 mg/dL (ref 100–199)
HDL: 44 mg/dL (ref >39)
LDL Chol Calc (NIH): 85 mg/dL (ref 0–99)
LDL/HDL Ratio: 1.9 ratio (ref 0.0–3.6)
Triglycerides: 65 mg/dL (ref 0–149)
VLDL Cholesterol Cal: 13 mg/dL (ref 5–40)

## 2021-04-27 LAB — LDL CHOLESTEROL, DIRECT: LDL Direct: 88 mg/dL (ref 0–99)

## 2021-05-07 ENCOUNTER — Ambulatory Visit
Admission: RE | Admit: 2021-05-07 | Discharge: 2021-05-07 | Disposition: A | Discharge status: Home / Self Care | Payer: No Typology Code available for payment source | Source: Ambulatory Visit | Attending: Cardiology | Admitting: Cardiology

## 2021-05-07 DIAGNOSIS — R911 Solitary pulmonary nodule: Secondary | ICD-10-CM | POA: Diagnosis not present

## 2021-05-07 DIAGNOSIS — E7800 Pure hypercholesterolemia, unspecified: Secondary | ICD-10-CM

## 2021-06-02 ENCOUNTER — Encounter: Payer: Self-pay | Admitting: Cardiology

## 2021-06-02 ENCOUNTER — Ambulatory Visit: Payer: BC Managed Care – PPO | Admitting: Cardiology

## 2021-06-02 VITALS — BP 143/106 | HR 79 | Temp 97.8°F | Resp 16 | Ht 75.0 in | Wt 321.0 lb

## 2021-06-02 DIAGNOSIS — I2510 Atherosclerotic heart disease of native coronary artery without angina pectoris: Secondary | ICD-10-CM | POA: Diagnosis not present

## 2021-06-02 DIAGNOSIS — E7800 Pure hypercholesterolemia, unspecified: Secondary | ICD-10-CM

## 2021-06-02 DIAGNOSIS — E6609 Other obesity due to excess calories: Secondary | ICD-10-CM

## 2021-06-02 DIAGNOSIS — R931 Abnormal findings on diagnostic imaging of heart and coronary circulation: Secondary | ICD-10-CM

## 2021-06-02 DIAGNOSIS — I429 Cardiomyopathy, unspecified: Secondary | ICD-10-CM | POA: Diagnosis not present

## 2021-06-02 DIAGNOSIS — R911 Solitary pulmonary nodule: Secondary | ICD-10-CM

## 2021-06-02 DIAGNOSIS — I10 Essential (primary) hypertension: Secondary | ICD-10-CM

## 2021-06-02 MED ORDER — ASPIRIN 81 MG PO TBEC: 81.0000 mg | DELAYED_RELEASE_TABLET | Freq: Every day | ORAL | 12 refills | Status: AC

## 2021-06-02 MED ORDER — METOPROLOL SUCCINATE ER 25 MG PO TB24: 25.0000 mg | ORAL_TABLET | Freq: Every day | ORAL | 0 refills | Status: DC

## 2021-06-02 MED ORDER — ENTRESTO 49-51 MG PO TABS: 1.0000 | ORAL_TABLET | Freq: Two times a day (BID) | ORAL | 0 refills | Status: DC

## 2021-06-02 NOTE — Progress Notes (Signed)
Date:  06/02/2021   ID:  Sean Norman, DOB 08-Nov-1955, MRN [id number]  PCP:  Lorenda Ishihara, MD  Cardiologist:  Tessa Lerner, DO, [facility] (established care March 31, 2021)  Date: 06/02/21 Last Office Visit: 03/31/2021  Chief Complaint  Patient presents with   Follow-up   Results    HPI  Sean Norman is a 67 y.o. African-American male whose past medical history and cardiovascular risk factors include: Cardiomyopathy, mild CAC (77.2AU and 74th percentile), hypercholesterolemia, hypertension, family history of CAD/CVA, vitiligo, obesity due to excess calories.   Referred to the practice for evaluation of hypertensive heart disease.  Since last office visit he had an echocardiogram which notes moderately reduced LVEF with global hypokinesis.  Coronary artery calcium score noted to be 77.2 placing him at the 74th percentile.  Clinically denies anginal chest pain or overt heart failure symptoms.  He does have lower extremity swelling bilaterally without orthopnea or PND.  Patient states that his home blood pressures are usually better controlled with SBP 130 mmHg and DBP between 80-90 mmHg.  Coronary artery calcium scoring also noted 3 mm pulmonary nodule in the left lower lobe.  Patient states that he has been a non-smoker.  FUNCTIONAL STATUS: Tries to walk 12 acres of his property twice a week.  ALLERGIES: No Known Allergies  MEDICATION LIST PRIOR TO VISIT: Current Meds  Medication Sig   aspirin EC 81 MG tablet Take 1 tablet (81 mg total) by mouth daily. Swallow whole.   atorvastatin (LIPITOR) 40 MG tablet Take 1 tablet (40 mg total) by mouth at bedtime.   hydrochlorothiazide (HYDRODIURIL) 25 MG tablet Take 1 tablet (25 mg total) by mouth daily.   metoprolol succinate (TOPROL XL) 25 MG 24 hr tablet Take 1 tablet (25 mg total) by mouth daily.   sacubitril-valsartan (ENTRESTO) 49-51 MG Take 1 tablet by mouth 2 (two) times daily.   [DISCONTINUED] losartan (COZAAR) 25 MG tablet Take  1 tablet (25 mg total) by mouth at bedtime.     PAST MEDICAL HISTORY: Past Medical History:  Diagnosis Date   Allergic rhinitis 05/02/2019   Chicken pox    Hyperlipidemia    Hypertension    Vitiligo     PAST SURGICAL HISTORY: Past Surgical History:  Procedure Laterality Date   COLONOSCOPY  2014   in [place]-polyp per [place]   NO PAST SURGERIES     POLYPECTOMY      FAMILY HISTORY: The patient family history includes Breast cancer in his maternal grandmother; Diabetes in his mother and paternal grandfather; Heart disease in his father, maternal grandfather, maternal grandmother, and mother; Hypertension in his father, maternal grandfather, maternal grandmother, and mother; Kidney disease in his mother; Stroke in his father and maternal grandfather.  SOCIAL HISTORY:  The patient  reports that he has never smoked. He has never used smokeless tobacco. He reports that he does not drink alcohol and does not use drugs.  REVIEW OF SYSTEMS: Review of Systems  Cardiovascular:  Negative for chest pain, dyspnea on exertion, leg swelling, palpitations and syncope.  Respiratory:  Negative for shortness of breath.    PHYSICAL EXAM:    06/02/2021   10:16 AM 06/02/2021   10:13 AM 03/31/2021   10:47 AM  Vitals with BMI  Height  6\' 3"  6\' 4"   Weight  321 lbs 320 lbs  BMI  40.12 38.97  Systolic 143 153  Diastolic 106 119 81  Pulse 79 97 90    CONSTITUTIONAL: Well-developed and well-nourished. No acute  distress.  SKIN: Skin is warm and dry. No rash noted. No cyanosis. No pallor. No jaundice.  vitiligo. HEAD: Normocephalic and atraumatic.  EYES: No scleral icterus MOUTH/THROAT: Moist oral membranes.  NECK: No JVD present. No thyromegaly noted. No carotid bruits  CHEST Normal respiratory effort. No intercostal retractions  LUNGS: Clear to auscultation bilaterally.  No stridor. No wheezes. No rales.  CARDIOVASCULAR: Regular rate and rhythm, positive S1-S2, no murmurs rubs or gallops  appreciated. ABDOMINAL: Soft, nontender, nondistended, positive bowel sounds in all 4 quadrants, no apparent ascites.  EXTREMITIES: +1 bilateral pitting edema.  warm to touch, 2+ bilateral DP and PT pulses HEMATOLOGIC: No significant bruising NEUROLOGIC: Oriented to person, place, and time. Nonfocal. Normal muscle tone.  PSYCHIATRIC: Normal mood and affect. Normal behavior. Cooperative  CARDIAC DATABASE: EKG: 03/31/2021: NSR, 77 bpm, poor R wave progression, LVH with voltage criteria.  Echocardiogram: 04/13/2021:  Left ventricle cavity is mildly dilated. Normal left ventricular wall thickness. Moderate global hypokinesis. LVEF 30-35%. Doppler evidence of grade I (impaired) diastolic dysfunction, normal LAP.  Left atrial cavity is mildly dilated. Aneurysmal interatrial septum  without 2D or color Doppler evidence of shunting.  The aortic root is normal. Mildly dilated ascending aorta at 3.9 cm.  Estimated right atrial pressure 8 mmHg.   Stress Testing: No results found for this or any previous visit from the past 1095 days.   Heart Catheterization: None  Calcium Score  05/07/2021 1. Total Agatston Score: 77.2 2. MESA database percentile: 74th 3. Small solid 3 mm pulmonary nodule of the left lower lobe. No follow-up needed if patient is low-risk.This recommendation follows the consensus statement: Guidelines for Management of Incidental Pulmonary Nodules Detected on CT Images: From the Fleischner Society 2017; Radiology 2017; 284:228-243.    LABORATORY DATA:  External Labs: Collected: February 18, 2021. Total cholesterol 233, triglycerides 99, HDL 45, LDL 171, non-HDL 189  Collected: March 17, 2021 provided by referring physician. Total cholesterol 174, triglycerides 72, HDL 41, LDL 120, non-HDL 134 Hemoglobin 14.3 g/dL, hematocrit 26.3% BUN 15, creatinine 1.04. Sodium 140, potassium 3.7, chloride 100, bicarb 34. AST 15, ALT 19, alkaline phosphatase 113   IMPRESSION:     ICD-10-CM   1. Cardiomyopathy, unspecified type (HCC)  I42.9 Basic Metabolic Panel (BMET)    Magnesium    Pro b natriuretic peptide (BNP)    metoprolol succinate (TOPROL XL) 25 MG 24 hr tablet    sacubitril-valsartan (ENTRESTO) 49-51 MG    PCV MYOCARDIAL PERFUSION WO LEXISCAN    2. Coronary atherosclerosis due to calcified coronary lesion  I25.10 aspirin EC 81 MG tablet   I25.84 PCV MYOCARDIAL PERFUSION WO LEXISCAN    3. Agatston coronary artery calcium score less than 100  R93.1 aspirin EC 81 MG tablet    PCV MYOCARDIAL PERFUSION WO LEXISCAN    4. Pure hypercholesterolemia  E78.00     5. Benign hypertension  I10     6. Pulmonary nodule  R91.1     7. Class 2 obesity due to excess calories without serious comorbidity with body mass index (BMI) of 38.0 to 38.9 in adult  E66.09    Z68.38        RECOMMENDATIONS: Sean Norman is a 66 y.o. African-American male whose past medical history and cardiac risk factors include: Hypercholesterolemia, hypertension, family history of CAD/CVA, vitiligo, obesity due to excess calories.   Cardiomyopathy, unspecified type (HCC) Newly discovered. Most likely nonischemic due to hypertensive heart disease.  But given the moderately reduced LVEF, global hypokinesis, and  mild CAC recommend ischemic work-up to rule out reversible ischemia.  We will order an exercise nuclear stress test to evaluate for functional status and reversible ischemia. Recommended uptitration of GDMT.  Discontinue losartan Start Entresto 49/51 mg p.o. twice daily and Toprol-XL 25 mg p.o. daily Baseline labs now and repeat labs in 1 week to evaluate kidney function and electrolytes.  Coronary atherosclerosis due to calcified coronary lesion Total CAC 77.2 AU, 74th percentile Start aspirin 81 mg p.o. daily. Has tolerated transition to atorvastatin 40 mg p.o. nightly.  Pure hypercholesterolemia Initial LDL 171 mg/dL followed 161 mg/dL. After increasing his atorvastatin to 40 mg  p.o. nightly repeat LDL is 88 mg/dL. Repeat labs noted stable AST and ALT.  And slight elevation in alkaline phosphatase from April 2023.  We will continue to monitor. Educated on importance of reducing dietary intake of high cholesterol foods. We will consider addition of Zetia at the next office visit.  Benign hypertension Office blood pressures are not well controlled. Patient states that home blood pressures are better controlled. Have asked him to take his blood pressures regularly along with daily weights and to bring in the log for review at the next visit. Reemphasized the importance of low-salt diet. Medication changes as noted above  Pulmonary nodule: Coronary artery calcium score as of April 2023 noted a 3 mm small solid pulmonary nodule in the left lower lobe.  Patient has been non-smoker.  Likely no additional work-up is warranted.  However, I have asked her to discuss this finding with PCP and continue work-up as recommended.  Class 2 obesity due to excess calories without serious comorbidity with body mass index (BMI) of 38.0 to 38.9 in adult Body mass index is 40.12 kg/m. I reviewed with the patient the importance of diet, regular physical activity/exercise, weight loss.   Patient is educated on increasing physical activity gradually as tolerated.  With the goal of moderate intensity exercise for 30 minutes a day 5 days a week.  FINAL MEDICATION LIST END OF ENCOUNTER: Meds ordered this encounter  Medications   metoprolol succinate (TOPROL XL) 25 MG 24 hr tablet    Sig: Take 1 tablet (25 mg total) by mouth daily.    Dispense:  90 tablet    Refill:  0   aspirin EC 81 MG tablet    Sig: Take 1 tablet (81 mg total) by mouth daily. Swallow whole.    Dispense:  30 tablet    Refill:  12   sacubitril-valsartan (ENTRESTO) 49-51 MG    Sig: Take 1 tablet by mouth 2 (two) times daily.    Dispense:  180 tablet    Refill:  0    Medications Discontinued During This Encounter   Medication Reason   amLODipine (NORVASC) 10 MG tablet Change in therapy   losartan (COZAAR) 25 MG tablet Change in therapy     Current Outpatient Medications:    aspirin EC 81 MG tablet, Take 1 tablet (81 mg total) by mouth daily. Swallow whole., Disp: 30 tablet, Rfl: 12   atorvastatin (LIPITOR) 40 MG tablet, Take 1 tablet (40 mg total) by mouth at bedtime., Disp: 90 tablet, Rfl: 0   hydrochlorothiazide (HYDRODIURIL) 25 MG tablet, Take 1 tablet (25 mg total) by mouth daily., Disp: 90 tablet, Rfl: 3   metoprolol succinate (TOPROL XL) 25 MG 24 hr tablet, Take 1 tablet (25 mg total) by mouth daily., Disp: 90 tablet, Rfl: 0   sacubitril-valsartan (ENTRESTO) 49-51 MG, Take 1 tablet by mouth 2 (  two) times daily., Disp: 180 tablet, Rfl: 0  Orders Placed This Encounter  Procedures   Basic Metabolic Panel (BMET)   Magnesium   Pro b natriuretic peptide (BNP)   PCV MYOCARDIAL PERFUSION WO LEXISCAN    There are no Patient Instructions on file for this visit.   --Continue cardiac medications as reconciled in final medication list. --Return in about 3 weeks (around 06/23/2021) for Follow up cardiomyopathy. . Or sooner if needed. --Continue follow-up with your primary care physician regarding the management of your other chronic comorbid conditions.  Patient's questions and concerns were addressed to his satisfaction. He voices understanding of the instructions provided during this encounter.   This note was created using a voice recognition software as a result there may be grammatical errors inadvertently enclosed that do not reflect the nature of this encounter. Every attempt is made to correct such errors.  Tessa Lerner, [place], [facility]  Pager: [phone] Office: [phone]

## 2021-06-03 LAB — BASIC METABOLIC PANEL
BUN/Creatinine Ratio: 16 (ref 10–24)
BUN: 15 mg/dL (ref 8–27)
CO2: 26 mmol/L (ref 20–29)
Calcium: 9.7 mg/dL (ref 8.6–10.2)
Chloride: 101 mmol/L (ref 96–106)
Creatinine, Ser: 0.92 mg/dL (ref 0.76–1.27)
Glucose: 95 mg/dL (ref 70–99)
Potassium: 3.9 mmol/L (ref 3.5–5.2)
Sodium: 141 mmol/L (ref 134–144)
eGFR: 93 mL/min/{1.73_m2} (ref >59)

## 2021-06-03 LAB — MAGNESIUM: Magnesium: 1.9 mg/dL (ref 1.6–2.3)

## 2021-06-03 LAB — PRO B NATRIURETIC PEPTIDE: NT-Pro BNP: 72 pg/mL (ref 0–210)

## 2021-06-16 DIAGNOSIS — I10 Essential (primary) hypertension: Secondary | ICD-10-CM | POA: Diagnosis not present

## 2021-06-16 DIAGNOSIS — I119 Hypertensive heart disease without heart failure: Secondary | ICD-10-CM | POA: Diagnosis not present

## 2021-06-16 DIAGNOSIS — Z8249 Family history of ischemic heart disease and other diseases of the circulatory system: Secondary | ICD-10-CM | POA: Diagnosis not present

## 2021-06-16 DIAGNOSIS — I2510 Atherosclerotic heart disease of native coronary artery without angina pectoris: Secondary | ICD-10-CM | POA: Diagnosis not present

## 2021-06-21 ENCOUNTER — Ambulatory Visit: Payer: BC Managed Care – PPO

## 2021-06-21 DIAGNOSIS — I429 Cardiomyopathy, unspecified: Secondary | ICD-10-CM | POA: Diagnosis not present

## 2021-06-21 DIAGNOSIS — R931 Abnormal findings on diagnostic imaging of heart and coronary circulation: Secondary | ICD-10-CM | POA: Diagnosis not present

## 2021-06-21 DIAGNOSIS — I2510 Atherosclerotic heart disease of native coronary artery without angina pectoris: Secondary | ICD-10-CM

## 2021-06-21 DIAGNOSIS — I2584 Coronary atherosclerosis due to calcified coronary lesion: Secondary | ICD-10-CM | POA: Diagnosis not present

## 2021-06-22 ENCOUNTER — Other Ambulatory Visit: Payer: Self-pay

## 2021-06-22 DIAGNOSIS — I429 Cardiomyopathy, unspecified: Secondary | ICD-10-CM

## 2021-06-22 NOTE — Progress Notes (Signed)
Called and spoke to patient and he is aware he needs to go get labs orders have been released

## 2021-06-24 ENCOUNTER — Other Ambulatory Visit: Payer: Self-pay | Admitting: Cardiology

## 2021-07-05 DIAGNOSIS — I429 Cardiomyopathy, unspecified: Secondary | ICD-10-CM | POA: Diagnosis not present

## 2021-07-06 ENCOUNTER — Ambulatory Visit: Payer: BC Managed Care – PPO | Admitting: Cardiology

## 2021-07-06 ENCOUNTER — Encounter: Payer: Self-pay | Admitting: Cardiology

## 2021-07-06 VITALS — BP 168/117 | HR 93 | Temp 95.4°F | Resp 16 | Ht 75.0 in | Wt 321.8 lb

## 2021-07-06 DIAGNOSIS — E7800 Pure hypercholesterolemia, unspecified: Secondary | ICD-10-CM

## 2021-07-06 DIAGNOSIS — I2510 Atherosclerotic heart disease of native coronary artery without angina pectoris: Secondary | ICD-10-CM | POA: Diagnosis not present

## 2021-07-06 DIAGNOSIS — I10 Essential (primary) hypertension: Secondary | ICD-10-CM

## 2021-07-06 DIAGNOSIS — I5042 Chronic combined systolic (congestive) and diastolic (congestive) heart failure: Secondary | ICD-10-CM

## 2021-07-06 DIAGNOSIS — I429 Cardiomyopathy, unspecified: Secondary | ICD-10-CM | POA: Diagnosis not present

## 2021-07-06 DIAGNOSIS — E6609 Other obesity due to excess calories: Secondary | ICD-10-CM

## 2021-07-06 DIAGNOSIS — R931 Abnormal findings on diagnostic imaging of heart and coronary circulation: Secondary | ICD-10-CM

## 2021-07-06 DIAGNOSIS — R911 Solitary pulmonary nodule: Secondary | ICD-10-CM

## 2021-07-06 DIAGNOSIS — I517 Cardiomegaly: Secondary | ICD-10-CM

## 2021-07-06 DIAGNOSIS — Z01818 Encounter for other preprocedural examination: Secondary | ICD-10-CM

## 2021-07-06 LAB — BASIC METABOLIC PANEL
BUN/Creatinine Ratio: 14 (ref 10–24)
BUN: 13 mg/dL (ref 8–27)
CO2: 25 mmol/L (ref 20–29)
Calcium: 9.3 mg/dL (ref 8.6–10.2)
Chloride: 101 mmol/L (ref 96–106)
Creatinine, Ser: 0.91 mg/dL (ref 0.76–1.27)
Glucose: 111 mg/dL — ABNORMAL HIGH (ref 70–99)
Potassium: 4 mmol/L (ref 3.5–5.2)
Sodium: 142 mmol/L (ref 134–144)
eGFR: 94 mL/min/{1.73_m2} (ref >59)

## 2021-07-06 LAB — PRO B NATRIURETIC PEPTIDE: NT-Pro BNP: <36 pg/mL (ref 0–210)

## 2021-07-06 LAB — MAGNESIUM: Magnesium: 1.9 mg/dL (ref 1.6–2.3)

## 2021-07-06 MED ORDER — IVABRADINE HCL 5 MG PO TABS: 5.0000 mg | ORAL_TABLET | Freq: Two times a day (BID) | ORAL | 0 refills | Status: DC

## 2021-07-06 MED ORDER — DAPAGLIFLOZIN PROPANEDIOL 10 MG PO TABS: 10.0000 mg | ORAL_TABLET | Freq: Every day | ORAL | 0 refills | Status: DC

## 2021-07-06 NOTE — Progress Notes (Signed)
Date:  07/06/2021   ID:  Sean Norman, DOB [birth year], MRN [id number]  PCP:  Lorenda Ishihara, MD  Cardiologist:  Tessa Lerner, DO, [facility] (established care March 31, 2021)  Date: 07/06/21 Last Office Visit: 06/02/2021  Chief Complaint  Patient presents with   Cardiomyopathy   Medical Clearance    HPI  Sean Norman is a 66 y.o. African-American male whose past medical history and cardiovascular risk factors include: Cardiomyopathy, mild CAC (77.2AU and 74th percentile), hypercholesterolemia, hypertension, family history of CAD/CVA, vitiligo, obesity due to excess calories.   Referred to the practice for evaluation of hypertensive heart disease.  Echo notes moderately reduced LVEF with global hypokinesis, CAC 77.2 placing him at the 74th percentile.  In the past patient has denied anginal discomfort at rest or with effort related activities.  And on physical examination he is remained euvolemic.  At last office visit he was recommended to undergo stress test to rule out reversible ischemia.  Results reviewed with him in great detail and noted below for further reference.  Patient is accompanied by his wife Elmarie Shiley who also provides collateral history as part of today's encounter.  According to her the patient has been experiencing effort related dyspnea,bilateral leg swelling. But no orthopnea or paroxysmal nocturnal dyspnea. Patient states that his home blood pressures are better controlled but I do not have a blood pressure log to review.  Patient is planning to undergo left knee arthroscopy as a part of Workmen's Compensation date to be determined.  FUNCTIONAL STATUS: Tries to walk 12 acres of his property twice a week.  ALLERGIES: No Known Allergies  MEDICATION LIST PRIOR TO VISIT: Current Meds  Medication Sig   aspirin EC 81 MG tablet Take 1 tablet (81 mg total) by mouth daily. Swallow whole.   atorvastatin (LIPITOR) 40 MG tablet Take 1 tablet (40 mg total) by mouth at  bedtime.   dapagliflozin propanediol (FARXIGA) 10 MG TABS tablet Take 1 tablet (10 mg total) by mouth daily before breakfast.   ivabradine (CORLANOR) 5 MG TABS tablet Take 1 tablet (5 mg total) by mouth 2 (two) times daily with a meal.   metoprolol succinate (TOPROL XL) 25 MG 24 hr tablet Take 1 tablet (25 mg total) by mouth daily.   sacubitril-valsartan (ENTRESTO) 49-51 MG Take 1 tablet by mouth 2 (two) times daily.   [DISCONTINUED] hydrochlorothiazide (HYDRODIURIL) 25 MG tablet Take 1 tablet (25 mg total) by mouth daily.     PAST MEDICAL HISTORY: Past Medical History:  Diagnosis Date   Allergic rhinitis 05/02/2019   Cardiomyopathy (HCC)    CHF (congestive heart failure) (HCC)    Chicken pox    Coronary artery calcification    Hyperlipidemia    Hypertension    Vitiligo     PAST SURGICAL HISTORY: Past Surgical History:  Procedure Laterality Date   COLONOSCOPY  2014   in [place]-polyp per pt   NO PAST SURGERIES     POLYPECTOMY      FAMILY HISTORY: The patient family history includes Breast cancer in his maternal grandmother; Diabetes in his mother and paternal grandfather; Heart disease in his father, maternal grandfather, maternal grandmother, and mother; Hypertension in his father, maternal grandfather, maternal grandmother, and mother; Kidney disease in his mother; Stroke in his father and maternal grandfather.  SOCIAL HISTORY:  The patient  reports that he has never smoked. He has never used smokeless tobacco. He reports that he does not drink alcohol and does not use drugs.  REVIEW OF  SYSTEMS: Review of Systems  Cardiovascular:  Positive for dyspnea on exertion. Negative for chest pain, leg swelling, palpitations and syncope.  Respiratory:  Positive for shortness of breath.     PHYSICAL EXAM:    07/06/2021   11:07 AM 07/06/2021   11:04 AM 06/02/2021   10:16 AM  Vitals with BMI  Height  6\' 3"    Weight  321 lbs 13 oz   BMI  40.22   Systolic 168 167 161   Diastolic 117 117 096  Pulse 93 116 79    CONSTITUTIONAL: Well-developed and well-nourished. No acute distress.  SKIN: Skin is warm and dry. No rash noted. No cyanosis. No pallor. No jaundice.  vitiligo. HEAD: Normocephalic and atraumatic.  EYES: No scleral icterus MOUTH/THROAT: Moist oral membranes.  NECK: No JVD present. No thyromegaly noted. No carotid bruits  CHEST Normal respiratory effort. No intercostal retractions  LUNGS: Clear to auscultation bilaterally.  No stridor. No wheezes. No rales.  CARDIOVASCULAR: Regular rate and rhythm, positive S1-S2, no murmurs rubs or gallops appreciated. ABDOMINAL: Soft, nontender, nondistended, positive bowel sounds in all 4 quadrants, no apparent ascites.  EXTREMITIES: +1 bilateral pitting edema.  warm to touch, 2+ bilateral DP and PT pulses HEMATOLOGIC: No significant bruising NEUROLOGIC: Oriented to person, place, and time. Nonfocal. Normal muscle tone.  PSYCHIATRIC: Normal mood and affect. Normal behavior. Cooperative  CARDIAC DATABASE: EKG: 03/31/2021: NSR, 77 bpm, poor R wave progression, LVH with voltage criteria. 07/06/2021: Normal sinus rhythm, 83 bpm, left axis, poor R wave progression, without underlying injury pattern.   Echocardiogram: 04/13/2021:  Left ventricle cavity is mildly dilated. Normal left ventricular wall thickness. Moderate global hypokinesis. LVEF 30-35%. Doppler evidence of grade I (impaired) diastolic dysfunction, normal LAP.  Left atrial cavity is mildly dilated. Aneurysmal interatrial septum  without 2D or color Doppler evidence of shunting.  The aortic root is normal. Mildly dilated ascending aorta at 3.9 cm.  Estimated right atrial pressure 8 mmHg.   Stress Testing: Exercise Sestamibi stress test 06/21/2021: Exercise nuclear stress test was performed using Bruce protocol. Patient reached 7 METS, and 87% of age predicted maximum heart rate. Exercise capacity was low. No chest pain reported. Normal heart rate  response. Hypertensive response with peak BP 210/108 mmHg. Stress EKG revealed no ischemic changes. Dilated LV cavity with severe global decrease in wall motion and myocardial thickening. Stress LVEF 20%. Decreased tracer uptake in inferior myocardium at both rest and stress, likely due to diaphragmatic attenuation. 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  High risk study due to reduced LVEF.   Heart Catheterization: None  Calcium Score  05/07/2021 1. Total Agatston Score: 77.2 2. MESA database percentile: 74th 3. Small solid 3 mm pulmonary nodule of the left lower lobe. No follow-up needed if patient is low-risk.This recommendation follows the consensus statement: Guidelines for Management of Incidental Pulmonary Nodules Detected on CT Images: From the Fleischner Society 2017; Radiology 2017; 284:228-243.    LABORATORY DATA:  External Labs: Collected: February 18, 2021. Total cholesterol 233, triglycerides 99, HDL 45, LDL 171, non-HDL 189  Collected: March 17, 2021 provided by referring physician. Total cholesterol 174, triglycerides 72, HDL 41, LDL 120, non-HDL 134 Hemoglobin 14.3 g/dL, hematocrit 04.5% BUN 15, creatinine 1.04. Sodium 140, potassium 3.7, chloride 100, bicarb 34. AST 15, ALT 19, alkaline phosphatase 113     Latest Ref Rng & Units 07/05/2021    1:10 PM 06/02/2021   12:03 PM 04/26/2021   11:21 AM  CMP  Glucose 70 - 99  mg/dL 119  95  97   BUN 8 - 27 mg/dL 13  15  17    Creatinine 0.76 - 1.27 mg/dL 1.47  8.29  5.62   Sodium 134 - 144 mmol/L 142  141  147   Potassium 3.5 - 5.2 mmol/L 4.0  3.9  4.2   Chloride 96 - 106 mmol/L 101  101  104   CO2 20 - 29 mmol/L 25  26  27    Calcium 8.6 - 10.2 mg/dL 9.3  9.7  9.4   Total Protein 6.0 - 8.5 g/dL   7.2   Total Bilirubin 0.0 - 1.2 mg/dL   0.5   Alkaline Phos 44 - 121 IU/L   126   AST 0 - 40 IU/L   14   ALT 0 - 44 IU/L   19     Lipid Panel  Lab Results  Component Value Date   CHOL 142  04/26/2021   HDL 44 04/26/2021   LDLCALC 85 04/26/2021   LDLDIRECT 88 04/26/2021   TRIG 65 04/26/2021   CHOLHDL 4 06/20/2019    No components found for: "NTPROBNP" Recent Labs    06/02/21 1203 07/05/21 1311  PROBNP 72 <36   No results for input(s): "TSH" in the last 8760 hours.  BMP Recent Labs    04/26/21 1121 06/02/21 1203 07/05/21 1310  NA 147* 141 142  K 4.2 3.9 4.0  CL 104 101 101  CO2 27 26 25   GLUCOSE 97 95 111*  BUN 17 15 13   CREATININE 1.01 0.92 0.91  CALCIUM 9.4 9.7 9.3    HEMOGLOBIN A1C Lab Results  Component Value Date   HGBA1C 6.4 06/20/2019    Cardiac Panel (last 3 results) No results for input(s): "CKTOTAL", "CKMB", "TROPONINIHS", "RELINDX" in the last 72 hours.  CHOLESTEROL Recent Labs    04/26/21 1122  CHOL 142    Hepatic Function Panel Recent Labs    04/26/21 1121  PROT 7.2  ALBUMIN 4.2  AST 14  ALT 19  ALKPHOS 126*  BILITOT 0.5      IMPRESSION:    ICD-10-CM   1. Cardiomyopathy, unspecified type (HCC)  I42.9 dapagliflozin propanediol (FARXIGA) 10 MG TABS tablet    ivabradine (CORLANOR) 5 MG TABS tablet    Basic metabolic panel    Pro b natriuretic peptide (BNP)    Magnesium    CT CORONARY MORPH W/CTA COR W/SCORE W/CA W/CM &/OR WO/CM    2. Chronic combined systolic and diastolic heart failure (HCC)  Z30.86 dapagliflozin propanediol (FARXIGA) 10 MG TABS tablet    ivabradine (CORLANOR) 5 MG TABS tablet    Basic metabolic panel    Pro b natriuretic peptide (BNP)    Magnesium    CT CORONARY MORPH W/CTA COR W/SCORE W/CA W/CM &/OR WO/CM    3. Coronary atherosclerosis due to calcified coronary lesion  I25.10 CT CORONARY MORPH W/CTA COR W/SCORE W/CA W/CM &/OR WO/CM   I25.84     4. Agatston coronary artery calcium score less than 100  R93.1 CT CORONARY MORPH W/CTA COR W/SCORE W/CA W/CM &/OR WO/CM    5. Pre-operative clearance  Z01.818 EKG 12-Lead    6. Pure hypercholesterolemia  E78.00     7. Benign hypertension  I10      8. Pulmonary nodule  R91.1     9. LVH (left ventricular hypertrophy)  I51.7     10. Class 2 obesity due to excess calories without serious comorbidity with body mass index (BMI) of  38.0 to 38.9 in adult  E66.09    Z68.38        RECOMMENDATIONS: Ryzen To is a 66 y.o. African-American male whose past medical history and cardiac risk factors include: Hypercholesterolemia, hypertension, family history of CAD/CVA, vitiligo, obesity due to excess calories.   Cardiomyopathy, unspecified type (HCC) Likely nonischemic, discovered in April 2023 Tolerated the initiation of Entresto well. Discontinue hydrochlorothiazide, start Farxiga 10 mg p.o. daily. Patient has been provided samples for 1 week.  Labs in 1 week to evaluate kidney function and electrolytes.  If stable patient will be recommended to pick up his [place] prescription (prescription is sent to his pharmacy).  And patient assistance card provided. Start Corlanor 5 mg p.o. twice daily. Continue Toprol-XL 25 mg p.o. daily. Exercise treadmill stress test: Noted hypertensive response to exercise, dilated LV cavity, severe global hypokinesis, calculated LVEF 20%, no obvious reversible myocardial perfusion defect but with diaphragmatic attenuation. Patient is asked to keep a log of his blood pressures morning and evening as well as morning weights. Reemphasized importance of a low-salt diet. Coronary CTA to evaluate for CAD given the new onset of congestive heart failure/cardiomyopathy  Coronary atherosclerosis due to calcified coronary lesion Total CAC 77.2 AU, 74th percentile Continue aspirin 81 mg p.o. daily. Continue atorvastatin 40 mg p.o. nightly. We will continue to monitor LDL.  Pure hypercholesterolemia Initial LDL 171 mg/dL and now  88 mg/dL. Educated on importance of reducing dietary intake of high cholesterol foods. We will consider addition of Zetia at the next office visit.  Benign hypertension Office blood pressures  are not well controlled. Patient states that home blood pressures are better controlled. Medication changes as discussed above. Reemphasized the importance of low-salt diet. I have asked both the patient and his wife to call us back sooner than his next office visit if he has blood pressure readings greater than 130 mmHg at home.  Pulmonary nodule: Coronary artery calcium score as of April 2023 noted a 3 mm small solid pulmonary nodule in the left lower lobe.  Patient has been non-smoker.  Likely no additional work-up is warranted.  However, I have asked both him and his wife to discuss this finding with PCP and continue work-up as recommended.  Class 2 obesity due to excess calories without serious comorbidity with body mass index (BMI) of 38.0 to 38.9 in adult Body mass index is 40.22 kg/m. I reviewed with the patient the importance of diet, regular physical activity/exercise, weight loss.   Patient is educated on increasing physical activity gradually as tolerated.  With the goal of moderate intensity exercise for 30 minutes a day 5 days a week.  FINAL MEDICATION LIST END OF ENCOUNTER: Meds ordered this encounter  Medications   dapagliflozin propanediol (FARXIGA) 10 MG TABS tablet    Sig: Take 1 tablet (10 mg total) by mouth daily before breakfast.    Dispense:  90 tablet    Refill:  0   ivabradine (CORLANOR) 5 MG TABS tablet    Sig: Take 1 tablet (5 mg total) by mouth 2 (two) times daily with a meal.    Dispense:  180 tablet    Refill:  0    Medications Discontinued During This Encounter  Medication Reason   hydrochlorothiazide (HYDRODIURIL) 25 MG tablet Change in therapy     Current Outpatient Medications:    aspirin EC 81 MG tablet, Take 1 tablet (81 mg total) by mouth daily. Swallow whole., Disp: 30 tablet, Rfl: 12   atorvastatin (LIPITOR)  40 MG tablet, Take 1 tablet (40 mg total) by mouth at bedtime., Disp: 90 tablet, Rfl: 0   dapagliflozin propanediol (FARXIGA) 10 MG  TABS tablet, Take 1 tablet (10 mg total) by mouth daily before breakfast., Disp: 90 tablet, Rfl: 0   ivabradine (CORLANOR) 5 MG TABS tablet, Take 1 tablet (5 mg total) by mouth 2 (two) times daily with a meal., Disp: 180 tablet, Rfl: 0   metoprolol succinate (TOPROL XL) 25 MG 24 hr tablet, Take 1 tablet (25 mg total) by mouth daily., Disp: 90 tablet, Rfl: 0   sacubitril-valsartan (ENTRESTO) 49-51 MG, Take 1 tablet by mouth 2 (two) times daily., Disp: 180 tablet, Rfl: 0  Orders Placed This Encounter  Procedures   CT CORONARY MORPH W/CTA COR W/SCORE W/CA W/CM &/OR WO/CM   Basic metabolic panel   Pro b natriuretic peptide (BNP)   Magnesium   EKG 12-Lead    There are no Patient Instructions on file for this visit.   --Continue cardiac medications as reconciled in final medication list. --Return in about 4 weeks (around 08/03/2021) for Follow up, heart failure management, CCTA. Or sooner if needed. --Continue follow-up with your primary care physician regarding the management of your other chronic comorbid conditions.  Patient's questions and concerns were addressed to his satisfaction. He voices understanding of the instructions provided during this encounter.   This note was created using a voice recognition software as a result there may be grammatical errors inadvertently enclosed that do not reflect the nature of this encounter. Every attempt is made to correct such errors.  Tessa Lerner, [place], [facility]  Pager: [phone] Office: [phone]

## 2021-07-26 ENCOUNTER — Other Ambulatory Visit (HOSPITAL_COMMUNITY): Payer: BC Managed Care – PPO

## 2021-07-27 ENCOUNTER — Other Ambulatory Visit: Payer: Self-pay | Admitting: Cardiology

## 2021-07-27 DIAGNOSIS — I5042 Chronic combined systolic (congestive) and diastolic (congestive) heart failure: Secondary | ICD-10-CM | POA: Diagnosis not present

## 2021-07-27 DIAGNOSIS — I429 Cardiomyopathy, unspecified: Secondary | ICD-10-CM | POA: Diagnosis not present

## 2021-07-28 LAB — BASIC METABOLIC PANEL
BUN/Creatinine Ratio: 16 (ref 10–24)
BUN: 16 mg/dL (ref 8–27)
CO2: 25 mmol/L (ref 20–29)
Calcium: 9.5 mg/dL (ref 8.6–10.2)
Chloride: 104 mmol/L (ref 96–106)
Creatinine, Ser: 0.99 mg/dL (ref 0.76–1.27)
Glucose: 98 mg/dL (ref 70–99)
Potassium: 4.1 mmol/L (ref 3.5–5.2)
Sodium: 142 mmol/L (ref 134–144)
eGFR: 85 mL/min/{1.73_m2} (ref >59)

## 2021-07-28 LAB — PRO B NATRIURETIC PEPTIDE: NT-Pro BNP: 54 pg/mL (ref 0–210)

## 2021-07-28 LAB — MAGNESIUM: Magnesium: 2.2 mg/dL (ref 1.6–2.3)

## 2021-08-02 ENCOUNTER — Telehealth (HOSPITAL_COMMUNITY): Payer: Self-pay | Admitting: Emergency Medicine

## 2021-08-02 ENCOUNTER — Other Ambulatory Visit (HOSPITAL_COMMUNITY): Payer: Self-pay

## 2021-08-02 ENCOUNTER — Telehealth (HOSPITAL_COMMUNITY): Payer: Self-pay | Admitting: *Deleted

## 2021-08-02 DIAGNOSIS — I2510 Atherosclerotic heart disease of native coronary artery without angina pectoris: Secondary | ICD-10-CM

## 2021-08-02 MED ORDER — IVABRADINE HCL 5 MG PO TABS
10.0000 mg | ORAL_TABLET | Freq: Once | ORAL | 0 refills | Status: AC
  Filled 2021-08-02: qty 2, 1d supply, fill #0

## 2021-08-02 NOTE — Telephone Encounter (Signed)
Attempted to call patient regarding upcoming cardiac CT appointment. °Left message on voicemail with name and callback number °Rindi Beechy RN Navigator Cardiac Imaging °[facility] °[phone] Office °[phone] Cell ° °

## 2021-08-02 NOTE — Progress Notes (Signed)
Called pt and Is aware of his lab results.

## 2021-08-02 NOTE — Telephone Encounter (Signed)
Spoke to patient regarding the rx for 10mg  ivabradine to the [facility]. He verbalized understanding to take this 2 hr prior to scan for HR control.  RN Navigator Cardiac Imaging [facility] [phone] Office  [phone] Cell

## 2021-08-02 NOTE — Telephone Encounter (Signed)
Patient returning call regarding upcoming cardiac imaging study; pt verbalizes understanding of appt date/time, parking situation and where to check in, pre-test NPO status and verified current allergies; name and call back number provided for further questions should they arise  Larey Brick RN Navigator Cardiac Imaging [facility] [phone] office [phone] cell  Patient aware to arrive at 2pm. He states he does not have ivabradine in his possession.  Informed him I will reach out to Dr. Odis Hollingshead to see about sending medication to another pharmacy for him to pick up.

## 2021-08-03 ENCOUNTER — Ambulatory Visit (HOSPITAL_COMMUNITY)
Admission: RE | Admit: 2021-08-03 | Discharge: 2021-08-03 | Disposition: A | Discharge status: Home / Self Care | Payer: BC Managed Care – PPO | Source: Ambulatory Visit | Attending: Internal Medicine | Admitting: Internal Medicine

## 2021-08-03 DIAGNOSIS — I5042 Chronic combined systolic (congestive) and diastolic (congestive) heart failure: Secondary | ICD-10-CM | POA: Diagnosis not present

## 2021-08-03 DIAGNOSIS — I2510 Atherosclerotic heart disease of native coronary artery without angina pectoris: Secondary | ICD-10-CM | POA: Diagnosis not present

## 2021-08-03 DIAGNOSIS — I2584 Coronary atherosclerosis due to calcified coronary lesion: Secondary | ICD-10-CM | POA: Insufficient documentation

## 2021-08-03 DIAGNOSIS — I429 Cardiomyopathy, unspecified: Secondary | ICD-10-CM | POA: Insufficient documentation

## 2021-08-03 DIAGNOSIS — I509 Heart failure, unspecified: Secondary | ICD-10-CM | POA: Diagnosis not present

## 2021-08-03 DIAGNOSIS — R931 Abnormal findings on diagnostic imaging of heart and coronary circulation: Secondary | ICD-10-CM | POA: Diagnosis not present

## 2021-08-03 MED ORDER — IOHEXOL 350 MG/ML SOLN
100.0000 mL | Freq: Once | INTRAVENOUS | Status: AC | PRN
  Administered 2021-08-03: 100 mL via INTRAVENOUS

## 2021-08-03 MED ORDER — NITROGLYCERIN 0.4 MG SL SUBL
SUBLINGUAL_TABLET | SUBLINGUAL | Status: AC
  Filled 2021-08-03: qty 2

## 2021-08-03 MED ORDER — NITROGLYCERIN 0.4 MG SL SUBL
0.8000 mg | SUBLINGUAL_TABLET | Freq: Once | SUBLINGUAL | Status: AC
  Administered 2021-08-03: 0.8 mg via SUBLINGUAL

## 2021-08-10 ENCOUNTER — Encounter: Payer: Self-pay | Admitting: Cardiology

## 2021-08-10 ENCOUNTER — Ambulatory Visit: Payer: BC Managed Care – PPO | Admitting: Cardiology

## 2021-08-10 VITALS — BP 160/89 | HR 87 | Temp 96.9°F | Resp 16 | Ht 75.0 in | Wt 316.0 lb

## 2021-08-10 DIAGNOSIS — I5042 Chronic combined systolic (congestive) and diastolic (congestive) heart failure: Secondary | ICD-10-CM | POA: Diagnosis not present

## 2021-08-10 DIAGNOSIS — I10 Essential (primary) hypertension: Secondary | ICD-10-CM

## 2021-08-10 DIAGNOSIS — E66812 Obesity, class 2: Secondary | ICD-10-CM

## 2021-08-10 DIAGNOSIS — R931 Abnormal findings on diagnostic imaging of heart and coronary circulation: Secondary | ICD-10-CM

## 2021-08-10 DIAGNOSIS — Z01818 Encounter for other preprocedural examination: Secondary | ICD-10-CM

## 2021-08-10 DIAGNOSIS — I428 Other cardiomyopathies: Secondary | ICD-10-CM | POA: Diagnosis not present

## 2021-08-10 DIAGNOSIS — I2510 Atherosclerotic heart disease of native coronary artery without angina pectoris: Secondary | ICD-10-CM

## 2021-08-10 DIAGNOSIS — E7800 Pure hypercholesterolemia, unspecified: Secondary | ICD-10-CM

## 2021-08-10 DIAGNOSIS — E6609 Other obesity due to excess calories: Secondary | ICD-10-CM

## 2021-08-10 MED ORDER — METOPROLOL SUCCINATE ER 50 MG PO TB24: 50.0000 mg | ORAL_TABLET | Freq: Every morning | ORAL | 0 refills | Status: DC

## 2021-08-10 MED ORDER — ENTRESTO 97-103 MG PO TABS: 1.0000 | ORAL_TABLET | Freq: Two times a day (BID) | ORAL | 0 refills | Status: DC

## 2021-08-10 NOTE — Progress Notes (Signed)
Date:  08/10/2021   ID:  Sean Norman, DOB [birth year], MRN [id number]  PCP:  Leeroy Cha, MD  Cardiologist:  Rex Kras, DO, [facility] (established care March 31, 2021)  Date: 08/10/21 Last Office Visit: 07/06/2021  Chief Complaint  Patient presents with    heart failure management   Results   Follow-up    HPI  Sean Norman is a 66 y.o. African-American male whose past medical history and cardiovascular risk factors include: Nonischemic cardiomyopathy, mild CAC, hypercholesterolemia, hypertension, family history of CAD/CVA, vitiligo, obesity due to excess calories.   Initially referred to the practice for hypertensive heart disease.  Echo noted LVEF of 30-35%, mildly dilated LV cavity, no significant valvular heart disease.  Exercise nuclear stress test reported to be high risk for reasons mentioned below.  Since last office visit he underwent coronary CTA which read illustrates mild coronary artery calcification and no obstructive CAD.  Patient was home blood pressures are still greater than 448 mmHg systolic (see media section).  Clinically denies angina pectoris.  Continues to have effort related dyspnea and lower extremity swelling.  He has lost approximately 5 pounds since last office visit due to diuresis and lifestyle changes.  Patient is planning to undergo left knee arthroscopy as a part of Workmen's Compensation date to be determined.  FUNCTIONAL STATUS: Tries to walk 12 acres of his property twice a week.  ALLERGIES: No Known Allergies  MEDICATION LIST PRIOR TO VISIT: Current Meds  Medication Sig   aspirin EC 81 MG tablet Take 1 tablet (81 mg total) by mouth daily. Swallow whole.   atorvastatin (LIPITOR) 40 MG tablet Take 1 tablet (40 mg total) by mouth at bedtime.   dapagliflozin propanediol (FARXIGA) 10 MG TABS tablet Take 1 tablet (10 mg total) by mouth daily before breakfast.   sacubitril-valsartan (ENTRESTO) 97-103 MG Take 1 tablet by mouth 2 (two) times  daily.   [DISCONTINUED] ivabradine (CORLANOR) 5 MG TABS tablet Take 1 tablet (5 mg total) by mouth 2 (two) times daily with a meal.   [DISCONTINUED] metoprolol succinate (TOPROL XL) 25 MG 24 hr tablet Take 1 tablet (25 mg total) by mouth daily.   [DISCONTINUED] sacubitril-valsartan (ENTRESTO) 49-51 MG Take 1 tablet by mouth 2 (two) times daily.     PAST MEDICAL HISTORY: Past Medical History:  Diagnosis Date   Allergic rhinitis 05/02/2019   Cardiomyopathy (HCC)    CHF (congestive heart failure) (HCC)    Chicken pox    Coronary artery calcification    Hyperlipidemia    Hypertension    Vitiligo     PAST SURGICAL HISTORY: Past Surgical History:  Procedure Laterality Date   COLONOSCOPY  2014   in [place]-polyp per pt   NO PAST SURGERIES     POLYPECTOMY      FAMILY HISTORY: The patient family history includes Breast cancer in his maternal grandmother; Diabetes in his mother and paternal grandfather; Heart disease in his father, maternal grandfather, maternal grandmother, and mother; Hypertension in his father, maternal grandfather, maternal grandmother, and mother; Kidney disease in his mother; Stroke in his father and maternal grandfather.  SOCIAL HISTORY:  The patient  reports that he has never smoked. He has never used smokeless tobacco. He reports that he does not drink alcohol and does not use drugs.  REVIEW OF SYSTEMS: Review of Systems  Constitutional: Positive for weight loss.  Cardiovascular:  Positive for dyspnea on exertion (Improving) and leg swelling (Improving). Negative for chest pain, palpitations and syncope.  PHYSICAL EXAM:    08/10/2021   12:05 PM 08/10/2021   11:40 AM 08/03/2021    2:24 PM  Vitals with BMI  Height  _0    Weight  316 lbs   BMI  00.8   Systolic 676 195 093  Diastolic 89 267 87  Pulse 87 88 63    CONSTITUTIONAL: Well-developed and well-nourished. No acute distress.  SKIN: Skin is warm and dry. No rash noted. No cyanosis. No  pallor. No jaundice.  vitiligo. HEAD: Normocephalic and atraumatic.  EYES: No scleral icterus MOUTH/THROAT: Moist oral membranes.  NECK: No JVD present. No thyromegaly noted. No carotid bruits  CHEST Normal respiratory effort. No intercostal retractions  LUNGS: Clear to auscultation bilaterally.  No stridor. No wheezes. No rales.  CARDIOVASCULAR: Regular rate and rhythm, positive S1-S2, no murmurs rubs or gallops appreciated. ABDOMINAL: Soft, nontender, nondistended, positive bowel sounds in all 4 quadrants, no apparent ascites.  EXTREMITIES: +1 bilateral pitting edema up to mid shin.  warm to touch, 2+ bilateral DP and PT pulses HEMATOLOGIC: No significant bruising NEUROLOGIC: Oriented to person, place, and time. Nonfocal. Normal muscle tone.  PSYCHIATRIC: Normal mood and affect. Normal behavior. Cooperative  CARDIAC DATABASE: EKG: 03/31/2021: NSR, 77 bpm, poor R wave progression, LVH with voltage criteria. 07/06/2021: Normal sinus rhythm, 83 bpm, left axis, poor R wave progression, without underlying injury pattern.   Echocardiogram: 04/13/2021:  Left ventricle cavity is mildly dilated. Normal left ventricular wall thickness. Moderate global hypokinesis. LVEF 30-35%. Doppler evidence of grade I (impaired) diastolic dysfunction, normal LAP.  Left atrial cavity is mildly dilated. Aneurysmal interatrial septum  without 2D or color Doppler evidence of shunting.  The aortic root is normal. Mildly dilated ascending aorta at 3.9 cm.  Estimated right atrial pressure 8 mmHg.   Stress Testing: Exercise Sestamibi stress test 06/21/2021: Exercise nuclear stress test was performed using Bruce protocol. Patient reached 7 METS, and 87% of age predicted maximum heart rate. Exercise capacity was low. No chest pain reported. Normal heart rate response. Hypertensive response with peak BP 210/108 mmHg. Stress EKG revealed no ischemic changes. Dilated LV cavity with severe global decrease in wall motion and  myocardial thickening. Stress LVEF 20%. Decreased tracer uptake in inferior myocardium at both rest and stress, likely due to diaphragmatic attenuation. 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  High risk study due to reduced LVEF.   Heart Catheterization: None  CCTA 08/03/2021: 1. Total coronary calcium score of 61.6. This was 71st percentile for age and sex matched control. 2. Normal coronary origin with left dominance. 3. CAD-RADS = 1. Left Main: Patent. LAD: Minimal plaque within proximal LAD otherwise patent. LCx: Patent RCA: Patent. 4. Stable small 3 mm left lower lobe pulmonary nodule which requires no follow-up. 5. No acute noncardiac cardiopulmonary finding.  LABORATORY DATA:  External Labs: Collected: February 18, 2021. Total cholesterol 233, triglycerides 99, HDL 45, LDL 171, non-HDL 189  Collected: March 17, 2021 provided by referring physician. Total cholesterol 174, triglycerides 72, HDL 41, LDL 120, non-HDL 134 Hemoglobin 14.3 g/dL, hematocrit 42.8% BUN 15, creatinine 1.04. Sodium 140, potassium 3.7, chloride 100, bicarb 34. AST 15, ALT 19, alkaline phosphatase 113     Latest Ref Rng & Units 07/27/2021    3:41 PM 07/05/2021    1:10 PM 06/02/2021   12:03 PM  CMP  Glucose 70 - 99 mg/dL 98  111  95   BUN 8 - 27 mg/dL _1 Creatinine 0.76 -  1.27 mg/dL 0.99  0.91  0.92   Sodium 134 - 144 mmol/L 142  142  141   Potassium 3.5 - 5.2 mmol/L 4.1  4.0  3.9   Chloride 96 - 106 mmol/L 104  101  101   CO2 20 - 29 mmol/L _0 Calcium 8.6 - 10.2 mg/dL 9.5  9.3  9.7     Lipid Panel  Lab Results  Component Value Date   CHOL 142 04/26/2021   HDL 44 04/26/2021   LDLCALC 85 04/26/2021   LDLDIRECT 88 04/26/2021   TRIG 65 04/26/2021   CHOLHDL 4 06/20/2019    No components found for: "NTPROBNP" Recent Labs    06/02/21 1203 07/05/21 1311 07/27/21 1541  PROBNP 72 <36 54   No results for input(s): "TSH" in the last 8760  hours.  BMP Recent Labs    06/02/21 1203 07/05/21 1310 07/27/21 1541  NA 141 142 142  K 3.9 4.0 4.1  CL 101 101 104  CO2 _1 GLUCOSE 95 111* 98  BUN _2 CREATININE 0.92 0.91 0.99  CALCIUM 9.7 9.3 9.5    HEMOGLOBIN A1C Lab Results  Component Value Date   HGBA1C 6.4 06/20/2019    Cardiac Panel (last 3 results) No results for input(s): "CKTOTAL", "CKMB", "TROPONINIHS", "RELINDX" in the last 72 hours.  CHOLESTEROL Recent Labs    04/26/21 1122  CHOL 142    Hepatic Function Panel Recent Labs    04/26/21 1121  PROT 7.2  ALBUMIN 4.2  AST 14  ALT 19  ALKPHOS 126*  BILITOT 0.5      IMPRESSION:    ICD-10-CM   1. Pre-operative clearance  Z01.818     2. Nonischemic cardiomyopathy (HCC)  I42.8 metoprolol succinate (TOPROL XL) 50 MG 24 hr tablet    sacubitril-valsartan (ENTRESTO) 97-103 MG    Lipid Panel With LDL/HDL Ratio    LDL cholesterol, direct    CMP14+EGFR    Ambulatory referral to Sleep Studies    3. Chronic combined systolic and diastolic heart failure (HCC)  I50.42     4. Coronary atherosclerosis due to calcified coronary lesion  I25.10    I25.84     5. Agatston coronary artery calcium score less than 100  R93.1 metoprolol succinate (TOPROL XL) 50 MG 24 hr tablet    sacubitril-valsartan (ENTRESTO) 97-103 MG    Lipid Panel With LDL/HDL Ratio    LDL cholesterol, direct    CMP14+EGFR    6. Pure hypercholesterolemia  E78.00 Lipid Panel With LDL/HDL Ratio    LDL cholesterol, direct    CMP14+EGFR    7. Benign hypertension  I10     8. Class 2 obesity due to excess calories without serious comorbidity with body mass index (BMI) of 39.0 to 39.9 in adult  E66.09    Z68.39         RECOMMENDATIONS: Idrissa Beville is a 66 y.o. African-American male whose past medical history and cardiac risk factors include: Nonischemic cardiomyopathy, mild CAC, hypercholesterolemia, hypertension, family history of CAD/CVA, vitiligo, obesity due to excess  calories.    Pre-operative clearance Patient has undergone appropriate cardiovascular testing. No obstructive CAD on recent coronary CTA. Denies angina pectoris.  Heart failure symptoms improving. We will continue to uptitrate GDMT. Patient is considered acceptable risk from a cardiovascular standpoint for upcoming left knee scope procedure. Would recommend procedure be performed in the hospital as opposed to surgical center if possible.  Nonischemic  cardiomyopathy ([place]) Likely secondary to hypertensive heart disease. Uptitrate Entresto.  Currently on Entresto 49/51 mg p.o. twice daily. For now I recommended taking Entresto 49/51 2 tablets twice daily.  Labs in 1 week.  If labs remain stable will send in a prescription for Entresto 97/103 mg p.o. twice daily. Continue Farxiga Uptitrate Toprol-XL to 50 mg p.o. daily Based on the labs and blood pressure readings at home we will likely add either BiDil or torsemide to his regimen. Patient does not use any phosphodiesterase 5 inhibitors for BPH or ED. We will refer him to sleep medicine for evaluation for sleep apnea.  Patient wife states that he sleeps fairly well at night but still feels tired and fatigue throughout the day, at times snoring at night, had a history of sleep apnea in the past  Chronic combined systolic and diastolic heart failure (HCC) Stage B, NYHA class II. Medication changes as discussed above  Coronary atherosclerosis due to calcified coronary lesion / Agatston coronary artery calcium score less than 100 Continue aspirin and statin therapy. Reviewed the results of the coronary CTA with both the patient and his wife at today's office visit and noted above for further reference  Pure hypercholesterolemia Index LDL 171 mg/dL. We will recheck fasting lipid profile to reevaluate lipids. Further recommendations to follow  Benign hypertension Office blood pressure not well controlled. Better readings at home.  Home blood  pressure log reviewed and uploaded under the media section. Patient would be a great candidate for remote patient monitoring-we will reconsider at the follow-up visit after he starts Medicare.  Reemphasized importance of a low-salt diet. Patient's wife is keeping a log of his blood pressures.  Class 2 obesity due to excess calories without serious comorbidity with body mass index (BMI) of 39.0 to 39.9 in adult Body mass index is 39.5 kg/m. I reviewed with the patient the importance of diet, regular physical activity/exercise, weight loss.   Patient is educated on increasing physical activity gradually as tolerated.  With the goal of moderate intensity exercise for 30 minutes a day 5 days a week.  FINAL MEDICATION LIST END OF ENCOUNTER: Meds ordered this encounter  Medications   metoprolol succinate (TOPROL XL) 50 MG 24 hr tablet    Sig: Take 1 tablet (50 mg total) by mouth every morning.    Dispense:  30 tablet    Refill:  0   sacubitril-valsartan (ENTRESTO) 97-103 MG    Sig: Take 1 tablet by mouth 2 (two) times daily.    Dispense:  180 tablet    Refill:  0    Medications Discontinued During This Encounter  Medication Reason   ivabradine (CORLANOR) 5 MG TABS tablet    sacubitril-valsartan (ENTRESTO) 49-51 MG Dose change   metoprolol succinate (TOPROL XL) 25 MG 24 hr tablet Reorder    Current Outpatient Medications:    aspirin EC 81 MG tablet, Take 1 tablet (81 mg total) by mouth daily. Swallow whole., Disp: 30 tablet, Rfl: 12   atorvastatin (LIPITOR) 40 MG tablet, Take 1 tablet (40 mg total) by mouth at bedtime., Disp: 90 tablet, Rfl: 0   dapagliflozin propanediol (FARXIGA) 10 MG TABS tablet, Take 1 tablet (10 mg total) by mouth daily before breakfast., Disp: 90 tablet, Rfl: 0   sacubitril-valsartan (ENTRESTO) 97-103 MG, Take 1 tablet by mouth 2 (two) times daily., Disp: 180 tablet, Rfl: 0   metoprolol succinate (TOPROL XL) 50 MG 24 hr tablet, Take 1 tablet (50 mg total) by mouth  every  morning., Disp: 30 tablet, Rfl: 0  Orders Placed This Encounter  Procedures   Lipid Panel With LDL/HDL Ratio   LDL cholesterol, direct   CMP14+EGFR   Ambulatory referral to Sleep Studies    There are no Patient Instructions on file for this visit.   --Continue cardiac medications as reconciled in final medication list. --Return in about 5 weeks (around 09/14/2021) for Follow up, heart failure management.. Or sooner if needed. --Continue follow-up with your primary care physician regarding the management of your other chronic comorbid conditions.  Patient's questions and concerns were addressed to his satisfaction. He voices understanding of the instructions provided during this encounter.   This note was created using a voice recognition software as a result there may be grammatical errors inadvertently enclosed that do not reflect the nature of this encounter. Every attempt is made to correct such errors.  Rex Kras, [place], [facility]  Pager: [phone] Office: [phone]

## 2021-08-19 ENCOUNTER — Other Ambulatory Visit: Payer: Self-pay | Admitting: Cardiology

## 2021-08-19 DIAGNOSIS — E7800 Pure hypercholesterolemia, unspecified: Secondary | ICD-10-CM | POA: Diagnosis not present

## 2021-08-19 DIAGNOSIS — I428 Other cardiomyopathies: Secondary | ICD-10-CM | POA: Diagnosis not present

## 2021-08-20 ENCOUNTER — Other Ambulatory Visit: Payer: Self-pay

## 2021-08-20 DIAGNOSIS — I428 Other cardiomyopathies: Secondary | ICD-10-CM

## 2021-08-20 DIAGNOSIS — R931 Abnormal findings on diagnostic imaging of heart and coronary circulation: Secondary | ICD-10-CM

## 2021-08-20 LAB — CMP14+EGFR
ALT: 17 IU/L (ref 0–44)
AST: 16 IU/L (ref 0–40)
Albumin/Globulin Ratio: 1.5 (ref 1.2–2.2)
Albumin: 4.3 g/dL (ref 3.9–4.9)
Alkaline Phosphatase: 129 IU/L — ABNORMAL HIGH (ref 44–121)
BUN/Creatinine Ratio: 14 (ref 10–24)
BUN: 14 mg/dL (ref 8–27)
Bilirubin Total: 0.5 mg/dL (ref 0.0–1.2)
CO2: 25 mmol/L (ref 20–29)
Calcium: 9.1 mg/dL (ref 8.6–10.2)
Chloride: 99 mmol/L (ref 96–106)
Creatinine, Ser: 1.01 mg/dL (ref 0.76–1.27)
Globulin, Total: 2.8 g/dL (ref 1.5–4.5)
Glucose: 102 mg/dL — ABNORMAL HIGH (ref 70–99)
Potassium: 4.3 mmol/L (ref 3.5–5.2)
Sodium: 142 mmol/L (ref 134–144)
Total Protein: 7.1 g/dL (ref 6.0–8.5)
eGFR: 83 mL/min/{1.73_m2} (ref >59)

## 2021-08-20 LAB — LIPID PANEL WITH LDL/HDL RATIO
Cholesterol, Total: 122 mg/dL (ref 100–199)
HDL: 46 mg/dL (ref >39)
LDL Chol Calc (NIH): 65 mg/dL (ref 0–99)
LDL/HDL Ratio: 1.4 ratio (ref 0.0–3.6)
Triglycerides: 49 mg/dL (ref 0–149)
VLDL Cholesterol Cal: 11 mg/dL (ref 5–40)

## 2021-08-20 LAB — LDL CHOLESTEROL, DIRECT: LDL Direct: 71 mg/dL (ref 0–99)

## 2021-08-20 MED ORDER — ENTRESTO 97-103 MG PO TABS: 1.0000 | ORAL_TABLET | Freq: Two times a day (BID) | ORAL | 0 refills | Status: AC

## 2021-08-20 NOTE — Progress Notes (Signed)
Called and spoke to patient he voiced understanding

## 2021-08-29 ENCOUNTER — Encounter: Payer: Self-pay | Admitting: Cardiology

## 2021-09-02 NOTE — Progress Notes (Signed)
Sent message, via epic in basket, requesting orders in epic from surgeon.  

## 2021-09-06 ENCOUNTER — Ambulatory Visit: Payer: Self-pay | Admitting: Orthopedic Surgery

## 2021-09-06 DIAGNOSIS — I10 Essential (primary) hypertension: Secondary | ICD-10-CM | POA: Diagnosis not present

## 2021-09-06 DIAGNOSIS — E669 Obesity, unspecified: Secondary | ICD-10-CM | POA: Diagnosis not present

## 2021-09-06 DIAGNOSIS — R0683 Snoring: Secondary | ICD-10-CM | POA: Diagnosis not present

## 2021-09-08 ENCOUNTER — Ambulatory Visit: Payer: Self-pay | Admitting: Orthopedic Surgery

## 2021-09-08 NOTE — H&P (View-Only) (Signed)
Sean Norman is an 66 y.o. male.   Chief Complaint: left knee pain HPI: Reason for Visit: left knee Context: work injury (04/10/2020); 1 year, 4 months Location (Lower Extremity): knee pain on the left Severity: pain level 2/10 Aggravating Factors: standing for long periods of time; walking for long periods of time Associated Symptoms: popping/clicking (occasional); no locking Are you working? modified duty (sedentary) Medications: No medication for his knee  Past Medical History:  Diagnosis Date   Allergic rhinitis 05/02/2019   Cardiomyopathy (HCC)    CHF (congestive heart failure) (HCC)    Chicken pox    Coronary artery calcification    Hyperlipidemia    Hypertension    Vitiligo     Past Surgical History:  Procedure Laterality Date   COLONOSCOPY  2014   in [place]-polyp per pt   NO PAST SURGERIES     POLYPECTOMY      Family History  Problem Relation Age of Onset   Heart disease Mother    Hypertension Mother    Kidney disease Mother    Diabetes Mother    Heart disease Father    Stroke Father    Hypertension Father    Breast cancer Maternal Grandmother    Hypertension Maternal Grandmother    Heart disease Maternal Grandmother    Hypertension Maternal Grandfather    Stroke Maternal Grandfather    Heart disease Maternal Grandfather    Diabetes Paternal Grandfather    Colon cancer Neg Hx    Colon polyps Neg Hx    Esophageal cancer Neg Hx    Rectal cancer Neg Hx    Stomach cancer Neg Hx    Social History:  reports that he has never smoked. He has never used smokeless tobacco. He reports that he does not drink alcohol and does not use drugs.  Allergies: No Known Allergies  Current meds: atorvastatin 40 mg tablet Bufferin Arthritis Strength 500 mg tablet Entresto 97 mg-103 mg tablet Farxiga 10 mg tablet hydroCHLOROthiazide 25 mg tablet ibuprofen 800 mg tablet losartan 25 mg tablet metoprolol succinate ER 50 mg tablet,extended release 24 hr Xofluza 80 mg  tablet  Review of Systems  Constitutional: Negative.   HENT: Negative.    Eyes: Negative.   Respiratory: Negative.    Cardiovascular: Negative.   Gastrointestinal: Negative.   Endocrine: Negative.   Genitourinary: Negative.   Musculoskeletal:  Positive for arthralgias, gait problem and joint swelling.  Skin: Negative.   Psychiatric/Behavioral: Negative.      There were no vitals taken for this visit. Physical Exam Constitutional:      Appearance: Normal appearance.  HENT:     Head: Normocephalic and atraumatic.     Right Ear: External ear normal.     Left Ear: External ear normal.     Nose: Nose normal.     Mouth/Throat:     Pharynx: Oropharynx is clear.  Eyes:     Conjunctiva/sclera: Conjunctivae normal.  Cardiovascular:     Rate and Rhythm: Normal rate and regular rhythm.     Pulses: Normal pulses.     Heart sounds: Normal heart sounds.  Pulmonary:     Effort: Pulmonary effort is normal.     Breath sounds: Normal breath sounds.  Abdominal:     General: Bowel sounds are normal.  Musculoskeletal:     Cervical back: Normal range of motion.     Comments: Constitutional: General Appearance: healthy-appearing and NAD.  Gait and Station: Appearance: antalgic gait.  Cardiovascular System: Arterial Pulses Left: femoral  normal, popliteal normal, dorsalis pedis normal, and posterior tibialis normal. Edema Left: no edema. Varicosities Left: no varicosities.  Lymph Nodes: Inspection/Palpation Left: no inguinal LAD.  Knees: Inspection Left: no deformity and swelling. Bony Palpation Left: no tenderness of the superior pole patella, the inferior pole patella, the tibial tubercle, the medial tibial plateau, Gerdy's tubercle, or the neck of fibula and tenderness of the medial joint line and the lateral joint line. Soft Tissue Palpation Left: no tenderness of the quadriceps tendon, the prepatellar bursa, the patellar tendon, the medial collateral ligament, or the infrapatellar tendon.  Active Range of Motion Left: limited. Stability Left: no laxity or ligamentous instability and anterior drawer sign negative and Lachman test negative. Special Tests Left: McMurray's test positive. Strength Left: no hamstring weakness or quadriceps weakness and flexion 5/5 and extension 5/5.  Skin: Left Lower Extremity: normal.  Neurologic: Ankle Reflex Left: normal (2). Knee Reflex Left: normal (2). Sensation on the Left: L2 normal, L3 normal, L4 normal, L5 normal, and S1 normal.  Psychiatric: Mood and Affect: active and alert and normal mood.  Neurological:     Mental Status: He is alert.    MRI of the left knee demonstrates a medial and lateral meniscus tear. Underlying osteoarthritis.   MRI of his left knee demonstrates medial and lateral meniscus tears. Horizontal in the medial. Horizontal in the body of the meniscus and the lateral. Severe arthrosis in the patellofemoral joint.  Assessment/Plan Impression:  1. Left knee pain and mechanical symptoms secondary to a medial and lateral meniscus tear of the left knee with underlying severe patellofemoral arthrosis. 2. Currently patient is at max medical improvement limited by the symptomatology in his left knee. His functional capacity is not to the point where he is able to return to work at his regular duties 3. Cardiac clearance obtained.  Plan:  We discussed options either living with his symptoms and permanent restrictions versus consideration of a knee arthroscopy partial medial lateral meniscectomies to address his mechanical symptoms and debridement We discussed that this should improve his symptoms however he would have the underlying osteoarthritis which she had prior to these injuries.  He would like to proceed with this I discussed the risk and benefits of knee arthroscopy including no changes in their symptoms worsening in their symptoms DVT, PE, anesthetic complications etc. Surgical possibilities include chondroplasty,  microfracture, partial meniscectomy, plica excision etc. We also discussed the possible need for repeat arthroscopy in the future as well as possible continued treatment including corticosteroid injections and possible Visco supplementation. In addition we discussed the possibility of even eventually required a total knee replacement if significant arthritis encountered. Also indicating that it is an outpatient procedure. 1-2 days on crutches. Postoperative DVT prophylaxis with aspirin if tolerated. Follow-up in the office in 2 weeks following the surgery. Possible consideration of formal of supervised physical therapy as well.  In the interim we will continue with sedentary work.  Preoperative clearance. Oxycodone postoperatively. Aspirin, Keflex. No history of MRSA  Plan Left knee scope, partial medial and lateral meniscectomy, debridement  Dorothy Spark, PA-C for Dr Shelle Iron 09/08/2021, 12:37 PM

## 2021-09-08 NOTE — Patient Instructions (Signed)
SURGICAL WAITING ROOM VISITATION Patients having surgery or a procedure may have no more than 2 support people in the waiting area - these visitors may rotate.   Children under the age of 55 must have an adult with them who is not the patient. If the patient needs to stay at the hospital during part of their recovery, the visitor guidelines for inpatient rooms apply. Pre-op nurse will coordinate an appropriate time for 1 support person to accompany patient in pre-op.  This support person may not rotate.    Please refer to the [facility] website for the visitor guidelines for Inpatients (after your surgery is over and you are in a regular room).    Your procedure is scheduled on: 09/22/21   Report to [facility] Main Entrance    Report to admitting at 9:20 AM   Call this number if you have problems the morning of surgery [phone]   Do not eat food :After Midnight.   After Midnight you may have the following liquids until 8:35 AM DAY OF SURGERY  Water Non-Citrus Juices (without pulp, NO RED) Carbonated Beverages Black Coffee (NO MILK/CREAM OR CREAMERS, sugar ok)  Clear Tea (NO MILK/CREAM OR CREAMERS, sugar ok) regular and decaf                             Plain Jell-O (NO RED)                                           Fruit ices (not with fruit pulp, NO RED)                                     Popsicles (NO RED)                                                               Sports drinks like Gatorade (NO RED)                The day of surgery:  Drink ONE (1) Pre-Surgery Clear Ensure at 8:35 AM the morning of surgery. Drink in one sitting. Do not sip.  This drink was given to you during your hospital  pre-op appointment visit. Nothing else to drink after completing the  Pre-Surgery Clear Ensure.          If you have questions, please contact your surgeon's office.   FOLLOW BOWEL PREP AND ANY ADDITIONAL PRE OP INSTRUCTIONS YOU RECEIVED FROM YOUR SURGEON'S OFFICE!!!      Oral Hygiene is also important to reduce your risk of infection.                                    Remember - BRUSH YOUR TEETH THE MORNING OF SURGERY WITH YOUR REGULAR TOOTHPASTE   Take these medicines the morning of surgery with A SIP OF WATER: Metoprolol    HOLD FARXIGA 72 HOURS before surgery  You may not have any metal on your body including jewelry, and body piercing             Do not wear lotions, powders, cologne, or deodorant              Men may shave face and neck.   Do not bring valuables to the hospital. [place] IS NOT             RESPONSIBLE   FOR VALUABLES.  DO NOT BRING YOUR HOME MEDICATIONS TO THE HOSPITAL. PHARMACY WILL DISPENSE MEDICATIONS LISTED ON YOUR MEDICATION LIST TO YOU DURING YOUR ADMISSION IN THE HOSPITAL!    Patients discharged on the day of surgery will not be allowed to drive home.  Someone NEEDS to stay with you for the first 24 hours after anesthesia.   Special Instructions: Bring a copy of your healthcare power of attorney and living will documents         the day of surgery if you haven't scanned them before.              Please read over the following fact sheets you were given: IF YOU HAVE QUESTIONS ABOUT YOUR PRE-OP INSTRUCTIONS PLEASE CALL [phone]- [facility] - Preparing for Surgery Before surgery, you can play an important role.  Because skin is not sterile, your skin needs to be as free of germs as possible.  You can reduce the number of germs on your skin by washing with CHG (chlorahexidine gluconate) soap before surgery.  CHG is an antiseptic cleaner which kills germs and bonds with the skin to continue killing germs even after washing. Please DO NOT use if you have an allergy to CHG or antibacterial soaps.  If your skin becomes reddened/irritated stop using the CHG and inform your nurse when you arrive at Short Stay. Do not shave (including legs and underarms) for at least 48 hours prior to  the first CHG shower.  You may shave your face/neck.  Please follow these instructions carefully:  1.  Shower with CHG Soap the night before surgery and the  morning of surgery.  2.  If you choose to wash your hair, wash your hair first as usual with your normal  shampoo.  3.  After you shampoo, rinse your hair and body thoroughly to remove the shampoo.                             4.  Use CHG as you would any other liquid soap.  You can apply chg directly to the skin and wash.  Gently with a scrungie or clean washcloth.  5.  Apply the CHG Soap to your body ONLY FROM THE NECK DOWN.   Do   not use on face/ open                           Wound or open sores. Avoid contact with eyes, ears mouth and   genitals (private parts).                       Wash face,  Genitals (private parts) with your normal soap.             6.  Wash thoroughly, paying special attention to the area where your    surgery  will be performed.  7.  Thoroughly rinse  your body with warm water from the neck down.  8.  DO NOT shower/wash with your normal soap after using and rinsing off the CHG Soap.                9.  Pat yourself dry with a clean towel.            10.  Wear clean pajamas.            11.  Place clean sheets on your bed the night of your first shower and do not  sleep with pets. Day of Surgery : Do not apply any lotions/deodorants the morning of surgery.  Please wear clean clothes to the hospital/surgery center.  FAILURE TO FOLLOW THESE INSTRUCTIONS MAY RESULT IN THE CANCELLATION OF YOUR SURGERY  PATIENT SIGNATURE_________________________________  NURSE SIGNATURE__________________________________  ________________________________________________________________________   Sean Norman  An incentive spirometer is a tool that can help keep your lungs clear and active. This tool measures how well you are filling your lungs with each breath. Taking long deep breaths may help reverse or decrease the  chance of developing breathing (pulmonary) problems (especially infection) following: A long period of time when you are unable to move or be active. BEFORE THE PROCEDURE  If the spirometer includes an indicator to show your best effort, your nurse or respiratory therapist will set it to a desired goal. If possible, sit up straight or lean slightly forward. Try not to slouch. Hold the incentive spirometer in an upright position. INSTRUCTIONS FOR USE  Sit on the edge of your bed if possible, or sit up as far as you can in bed or on a chair. Hold the incentive spirometer in an upright position. Breathe out normally. Place the mouthpiece in your mouth and seal your lips tightly around it. Breathe in slowly and as deeply as possible, raising the piston or the ball toward the top of the column. Hold your breath for 3-5 seconds or for as long as possible. Allow the piston or ball to fall to the bottom of the column. Remove the mouthpiece from your mouth and breathe out normally. Rest for a few seconds and repeat Steps 1 through 7 at least 10 times every 1-2 hours when you are awake. Take your time and take a few normal breaths between deep breaths. The spirometer may include an indicator to show your best effort. Use the indicator as a goal to work toward during each repetition. After each set of 10 deep breaths, practice coughing to be sure your lungs are clear. If you have an incision (the cut made at the time of surgery), support your incision when coughing by placing a pillow or rolled up towels firmly against it. Once you are able to get out of bed, walk around indoors and cough well. You may stop using the incentive spirometer when instructed by your caregiver.  RISKS AND COMPLICATIONS Take your time so you do not get dizzy or light-headed. If you are in pain, you may need to take or ask for pain medication before doing incentive spirometry. It is harder to take a deep breath if you are having  pain. AFTER USE Rest and breathe slowly and easily. It can be helpful to keep track of a log of your progress. Your caregiver can provide you with a simple table to help with this. If you are using the spirometer at home, follow these instructions: SEEK MEDICAL CARE IF:  You are having difficultly using the spirometer. You have  trouble using the spirometer as often as instructed. Your pain medication is not giving enough relief while using the spirometer. You develop fever of 100.5 F (38.1 C) or higher. SEEK IMMEDIATE MEDICAL CARE IF:  You cough up bloody sputum that had not been present before. You develop fever of 102 F (38.9 C) or greater. You develop worsening pain at or near the incision site. MAKE SURE YOU:  Understand these instructions. Will watch your condition. Will get help right away if you are not doing well or get worse. Document Released: 05/09/2006 Document Revised: 03/21/2011 Document Reviewed: 07/10/2006 [facility] Patient Information 2014 [place], [place].   ________________________________________________________________________

## 2021-09-08 NOTE — Progress Notes (Signed)
COVID Vaccine Completed:yes x2  Date of COVID positive in last 90 days:  PCP - Lorenda Ishihara, MD Cardiologist -   Cardiac clearance by Sunit Toila 08/10/21 in Epic  Chest x-ray - 11/18/20 [facility] EKG - 07/06/21 Epic Stress Test - 06/21/21 Epic ECHO - 04/13/21 Epic Cardiac Cath -  Pacemaker/ICD device last checked: Spinal Cord Stimulator:  Bowel Prep -   Sleep Study -  CPAP -   Fasting Blood Sugar -  Checks Blood Sugar _____ times a day  Blood Thinner Instructions: Aspirin Instructions: ASA 81 Last Dose:  Activity level:  Can go up a flight of stairs and perform activities of daily living without stopping and without symptoms of chest pain or shortness of breath.  Able to exercise without symptoms  Unable to go up a flight of stairs without symptoms of     Anesthesia review: HTN, CAD, CHF, cardiomyopathy   Patient denies shortness of breath, fever, cough and chest pain at PAT appointment  Patient verbalized understanding of instructions that were given to them at the PAT appointment. Patient was also instructed that they will need to review over the PAT instructions again at home before surgery.

## 2021-09-08 NOTE — H&P (Signed)
Alexei Parlin is an 66 y.o. male.   Chief Complaint: left knee pain HPI: Reason for Visit: left knee Context: work injury (04/10/2020); 1 year, 4 months Location (Lower Extremity): knee pain on the left Severity: pain level 2/10 Aggravating Factors: standing for long periods of time; walking for long periods of time Associated Symptoms: popping/clicking (occasional); no locking Are you working? modified duty (sedentary) Medications: No medication for his knee  Past Medical History:  Diagnosis Date   Allergic rhinitis 05/02/2019   Cardiomyopathy (HCC)    CHF (congestive heart failure) (HCC)    Chicken pox    Coronary artery calcification    Hyperlipidemia    Hypertension    Vitiligo     Past Surgical History:  Procedure Laterality Date   COLONOSCOPY  2014   in [place]-polyp per pt   NO PAST SURGERIES     POLYPECTOMY      Family History  Problem Relation Age of Onset   Heart disease Mother    Hypertension Mother    Kidney disease Mother    Diabetes Mother    Heart disease Father    Stroke Father    Hypertension Father    Breast cancer Maternal Grandmother    Hypertension Maternal Grandmother    Heart disease Maternal Grandmother    Hypertension Maternal Grandfather    Stroke Maternal Grandfather    Heart disease Maternal Grandfather    Diabetes Paternal Grandfather    Colon cancer Neg Hx    Colon polyps Neg Hx    Esophageal cancer Neg Hx    Rectal cancer Neg Hx    Stomach cancer Neg Hx    Social History:  reports that he has never smoked. He has never used smokeless tobacco. He reports that he does not drink alcohol and does not use drugs.  Allergies: No Known Allergies  Current meds: atorvastatin 40 mg tablet Bufferin Arthritis Strength 500 mg tablet Entresto 97 mg-103 mg tablet Farxiga 10 mg tablet hydroCHLOROthiazide 25 mg tablet ibuprofen 800 mg tablet losartan 25 mg tablet metoprolol succinate ER 50 mg tablet,extended release 24 hr Xofluza 80 mg  tablet  Review of Systems  Constitutional: Negative.   HENT: Negative.    Eyes: Negative.   Respiratory: Negative.    Cardiovascular: Negative.   Gastrointestinal: Negative.   Endocrine: Negative.   Genitourinary: Negative.   Musculoskeletal:  Positive for arthralgias, gait problem and joint swelling.  Skin: Negative.   Psychiatric/Behavioral: Negative.      There were no vitals taken for this visit. Physical Exam Constitutional:      Appearance: Normal appearance.  HENT:     Head: Normocephalic and atraumatic.     Right Ear: External ear normal.     Left Ear: External ear normal.     Nose: Nose normal.     Mouth/Throat:     Pharynx: Oropharynx is clear.  Eyes:     Conjunctiva/sclera: Conjunctivae normal.  Cardiovascular:     Rate and Rhythm: Normal rate and regular rhythm.     Pulses: Normal pulses.     Heart sounds: Normal heart sounds.  Pulmonary:     Effort: Pulmonary effort is normal.     Breath sounds: Normal breath sounds.  Abdominal:     General: Bowel sounds are normal.  Musculoskeletal:     Cervical back: Normal range of motion.     Comments: Constitutional: General Appearance: healthy-appearing and NAD.  Gait and Station: Appearance: antalgic gait.  Cardiovascular System: Arterial Pulses Left: femoral   normal, popliteal normal, dorsalis pedis normal, and posterior tibialis normal. Edema Left: no edema. Varicosities Left: no varicosities.  Lymph Nodes: Inspection/Palpation Left: no inguinal LAD.  Knees: Inspection Left: no deformity and swelling. Bony Palpation Left: no tenderness of the superior pole patella, the inferior pole patella, the tibial tubercle, the medial tibial plateau, Gerdy's tubercle, or the neck of fibula and tenderness of the medial joint line and the lateral joint line. Soft Tissue Palpation Left: no tenderness of the quadriceps tendon, the prepatellar bursa, the patellar tendon, the medial collateral ligament, or the infrapatellar tendon.  Active Range of Motion Left: limited. Stability Left: no laxity or ligamentous instability and anterior drawer sign negative and Lachman test negative. Special Tests Left: McMurray's test positive. Strength Left: no hamstring weakness or quadriceps weakness and flexion 5/5 and extension 5/5.  Skin: Left Lower Extremity: normal.  Neurologic: Ankle Reflex Left: normal (2). Knee Reflex Left: normal (2). Sensation on the Left: L2 normal, L3 normal, L4 normal, L5 normal, and S1 normal.  Psychiatric: Mood and Affect: active and alert and normal mood.  Neurological:     Mental Status: He is alert.    MRI of the left knee demonstrates a medial and lateral meniscus tear. Underlying osteoarthritis.   MRI of his left knee demonstrates medial and lateral meniscus tears. Horizontal in the medial. Horizontal in the body of the meniscus and the lateral. Severe arthrosis in the patellofemoral joint.  Assessment/Plan Impression:  1. Left knee pain and mechanical symptoms secondary to a medial and lateral meniscus tear of the left knee with underlying severe patellofemoral arthrosis. 2. Currently patient is at max medical improvement limited by the symptomatology in his left knee. His functional capacity is not to the point where he is able to return to work at his regular duties 3. Cardiac clearance obtained.  Plan:  We discussed options either living with his symptoms and permanent restrictions versus consideration of a knee arthroscopy partial medial lateral meniscectomies to address his mechanical symptoms and debridement We discussed that this should improve his symptoms however he would have the underlying osteoarthritis which she had prior to these injuries.  He would like to proceed with this I discussed the risk and benefits of knee arthroscopy including no changes in their symptoms worsening in their symptoms DVT, PE, anesthetic complications etc. Surgical possibilities include chondroplasty,  microfracture, partial meniscectomy, plica excision etc. We also discussed the possible need for repeat arthroscopy in the future as well as possible continued treatment including corticosteroid injections and possible Visco supplementation. In addition we discussed the possibility of even eventually required a total knee replacement if significant arthritis encountered. Also indicating that it is an outpatient procedure. 1-2 days on crutches. Postoperative DVT prophylaxis with aspirin if tolerated. Follow-up in the office in 2 weeks following the surgery. Possible consideration of formal of supervised physical therapy as well.  In the interim we will continue with sedentary work.  Preoperative clearance. Oxycodone postoperatively. Aspirin, Keflex. No history of MRSA  Plan Left knee scope, partial medial and lateral meniscectomy, debridement  Dorothy Spark, PA-C for Dr Shelle Iron 09/08/2021, 12:37 PM

## 2021-09-09 ENCOUNTER — Encounter (HOSPITAL_COMMUNITY)
Admission: RE | Admit: 2021-09-09 | Discharge: 2021-09-09 | Disposition: A | Discharge status: Home / Self Care | Payer: BC Managed Care – PPO | Source: Ambulatory Visit | Attending: Specialist | Admitting: Specialist

## 2021-09-09 ENCOUNTER — Encounter (HOSPITAL_COMMUNITY): Payer: Self-pay

## 2021-09-09 VITALS — BP 165/112 | HR 59 | Temp 97.7°F | Resp 16 | Ht 74.0 in | Wt 307.0 lb

## 2021-09-09 DIAGNOSIS — Z01812 Encounter for preprocedural laboratory examination: Secondary | ICD-10-CM | POA: Insufficient documentation

## 2021-09-09 DIAGNOSIS — I10 Essential (primary) hypertension: Secondary | ICD-10-CM | POA: Diagnosis not present

## 2021-09-09 LAB — BASIC METABOLIC PANEL
Anion gap: 7 (ref 5–15)
BUN: 13 mg/dL (ref 8–23)
CO2: 27 mmol/L (ref 22–32)
Calcium: 8.8 mg/dL — ABNORMAL LOW (ref 8.9–10.3)
Chloride: 105 mmol/L (ref 98–111)
Creatinine, Ser: 0.95 mg/dL (ref 0.61–1.24)
GFR, Estimated: >60 mL/min (ref >60)
Glucose, Bld: 97 mg/dL (ref 70–99)
Potassium: 3.8 mmol/L (ref 3.5–5.1)
Sodium: 139 mmol/L (ref 135–145)

## 2021-09-09 LAB — CBC
HCT: 46 % (ref 39.0–52.0)
Hemoglobin: 14.8 g/dL (ref 13.0–17.0)
MCH: 29.8 pg (ref 26.0–34.0)
MCHC: 32.2 g/dL (ref 30.0–36.0)
MCV: 92.7 fL (ref 80.0–100.0)
Platelets: 233 10*3/uL (ref 150–400)
RBC: 4.96 MIL/uL (ref 4.22–5.81)
RDW: 13.7 % (ref 11.5–15.5)
WBC: 3.9 10*3/uL — ABNORMAL LOW (ref 4.0–10.5)
nRBC: 0 % (ref 0.0–0.2)

## 2021-09-10 ENCOUNTER — Other Ambulatory Visit: Payer: Self-pay | Admitting: Cardiology

## 2021-09-10 DIAGNOSIS — I429 Cardiomyopathy, unspecified: Secondary | ICD-10-CM

## 2021-09-10 NOTE — Progress Notes (Addendum)
Anesthesia Chart Review   Case: [id number] Date/Time: 09/22/21 1122   Procedure: KNEE ARTHROSCOPY WITH MEDIAL AND LATERAL MENISECTOMY AND DEBRIDEMENT (Left: Knee)   Anesthesia type: Choice   Pre-op diagnosis: Left knee medial and lateral meniscus tears   Location: [facility] ROOM 10 / [facility]   Surgeons: Jene Every, MD       DISCUSSION:66 y.o. never smoker with h/o HTN, CHF, left knee medial and lateral meniscus tear scheduled for above procedure 09/22/2021 with Dr. Jene Every.   Pt seen by cardiology 08/10/2021 for preoperative evaluation.  Per OV note, "Pre-operative clearance Patient has undergone appropriate cardiovascular testing. No obstructive CAD on recent coronary CTA. Denies angina pectoris.  Heart failure symptoms improving. We will continue to uptitrate GDMT. Patient is considered acceptable risk from a cardiovascular standpoint for upcoming left knee scope procedure. Would recommend procedure be performed in the hospital as opposed to surgical center if possible."  Elevated BP at PAT visit, 165/112. Pt advised to continue to monitor pressures at home and contact cardiology office. Discussed risk of same day cancellation.   BP Readings from Last 3 Encounters:  09/09/21 (!) 165/112  08/10/21 (!) 160/89  08/03/21 131/87    VS: BP (!) 165/112   Pulse (!) 59   Temp 36.5 C (Oral)   Resp 16   Ht 6\' 2"  (1.88 m)   Wt (!) 139.3 kg   SpO2 97%   BMI 39.42 kg/m   PROVIDERS: , MD is PCP   Cardiologist - Lorenda Ishihara, MD LABS: Labs reviewed: Acceptable for surgery. (all labs ordered are listed, but only abnormal results are displayed)  Labs Reviewed  CBC - Abnormal; Notable for the following components:      Result Value   WBC 3.9 (*)    All other components within normal limits  BASIC METABOLIC PANEL - Abnormal; Notable for the following components:   Calcium 8.8 (*)    All other components within normal limits     IMAGES: CT Cardiac Scoring  05/07/2021 IMPRESSION: 1. Total Agatston Score: 77.2 2. MESA database percentile: 74th 3. Small solid 3 mm pulmonary nodule of the left lower lobe. No follow-up needed if patient is low-risk.This recommendation follows the consensus statement: Guidelines for Management of Incidental Pulmonary Nodules Detected on CT Images: From the Fleischner Society 2017; Radiology 2017; 284:228-243.  EKG:   CV: Exercise Sestamibi stress test 06/21/2021: Exercise nuclear stress test was performed using Bruce protocol. Patient reached 7 METS, and 87% of age predicted maximum heart rate. Exercise capacity was low. No chest pain reported. Normal heart rate response. Hypertensive response with peak BP 210/108 mmHg. Stress EKG revealed no ischemic changes. Dilated LV cavity with severe global decrease in wall motion and myocardial thickening. Stress LVEF 20%. Decreased tracer uptake in inferior myocardium at both rest and stress, likely due to diaphragmatic attenuation. 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  High risk study due to reduced LVEF.   Echocardiogram 04/13/2021:  Left ventricle cavity is mildly dilated. Normal left ventricular wall  thickness. Moderate global hypokinesis. LVEF 30-35%. Doppler evidence of  grade I (impaired) diastolic dysfunction, normal LAP.  Left atrial cavity is mildly dilated. Aneurysmal interatrial septum  without 2D or color Doppler evidence of shunting.  The aortic root is normal. Mildly dilated ascending aorta at 3.9 cm.  Estimated right atrial pressure 8 mmHg. Past Medical History:  Diagnosis Date   Allergic rhinitis 05/02/2019   Cardiomyopathy (HCC)    CHF (congestive heart failure) (HCC)  Chicken pox    Coronary artery calcification    Hyperlipidemia    Hypertension    Vitiligo     Past Surgical History:  Procedure Laterality Date   COLONOSCOPY  2014   in [place]-polyp per pt   NO PAST SURGERIES     POLYPECTOMY       MEDICATIONS:  aspirin EC 81 MG tablet   atorvastatin (LIPITOR) 40 MG tablet   dapagliflozin propanediol (FARXIGA) 10 MG TABS tablet   metoprolol succinate (TOPROL XL) 50 MG 24 hr tablet   sacubitril-valsartan (ENTRESTO) 97-103 MG   No current facility-administered medications for this encounter.    Jodell Cipro Ward, PA-C [facility] Pre-Surgical Testing [phone]

## 2021-09-14 ENCOUNTER — Ambulatory Visit: Payer: BC Managed Care – PPO | Admitting: Cardiology

## 2021-09-17 ENCOUNTER — Other Ambulatory Visit: Payer: Self-pay | Admitting: Cardiology

## 2021-09-17 DIAGNOSIS — R931 Abnormal findings on diagnostic imaging of heart and coronary circulation: Secondary | ICD-10-CM

## 2021-09-17 DIAGNOSIS — I428 Other cardiomyopathies: Secondary | ICD-10-CM

## 2021-09-22 ENCOUNTER — Ambulatory Visit (HOSPITAL_COMMUNITY)
Admission: RE | Admit: 2021-09-22 | Discharge: 2021-09-22 | Disposition: A | Discharge status: Home / Self Care | Source: Ambulatory Visit | Attending: Specialist | Admitting: Specialist

## 2021-09-22 ENCOUNTER — Other Ambulatory Visit: Payer: Self-pay

## 2021-09-22 ENCOUNTER — Ambulatory Visit (HOSPITAL_COMMUNITY): Admitting: Physician Assistant

## 2021-09-22 ENCOUNTER — Encounter (HOSPITAL_COMMUNITY)
Admission: RE | Disposition: A | Discharge status: Home / Self Care | Payer: Self-pay | Source: Ambulatory Visit | Attending: Specialist

## 2021-09-22 ENCOUNTER — Ambulatory Visit (HOSPITAL_BASED_OUTPATIENT_CLINIC_OR_DEPARTMENT_OTHER): Admitting: Certified Registered"

## 2021-09-22 ENCOUNTER — Encounter (HOSPITAL_COMMUNITY): Payer: Self-pay | Admitting: Specialist

## 2021-09-22 DIAGNOSIS — I2510 Atherosclerotic heart disease of native coronary artery without angina pectoris: Secondary | ICD-10-CM

## 2021-09-22 DIAGNOSIS — I110 Hypertensive heart disease with heart failure: Secondary | ICD-10-CM | POA: Diagnosis not present

## 2021-09-22 DIAGNOSIS — M1712 Unilateral primary osteoarthritis, left knee: Secondary | ICD-10-CM

## 2021-09-22 DIAGNOSIS — M94262 Chondromalacia, left knee: Secondary | ICD-10-CM | POA: Diagnosis not present

## 2021-09-22 DIAGNOSIS — I509 Heart failure, unspecified: Secondary | ICD-10-CM | POA: Insufficient documentation

## 2021-09-22 DIAGNOSIS — X58XXXA Exposure to other specified factors, initial encounter: Secondary | ICD-10-CM | POA: Diagnosis not present

## 2021-09-22 DIAGNOSIS — Z6839 Body mass index (BMI) 39.0-39.9, adult: Secondary | ICD-10-CM | POA: Diagnosis not present

## 2021-09-22 DIAGNOSIS — S83242A Other tear of medial meniscus, current injury, left knee, initial encounter: Secondary | ICD-10-CM

## 2021-09-22 DIAGNOSIS — S83282A Other tear of lateral meniscus, current injury, left knee, initial encounter: Secondary | ICD-10-CM | POA: Insufficient documentation

## 2021-09-22 HISTORY — PX: KNEE ARTHROSCOPY WITH LATERAL MENISECTOMY: SHX6193

## 2021-09-22 SURGERY — ARTHROSCOPY, KNEE, WITH LATERAL MENISCECTOMY
Anesthesia: General | Site: Knee | Laterality: Left

## 2021-09-22 MED ORDER — DEXTROSE 5 % IV SOLN
INTRAVENOUS | Status: DC | PRN
  Administered 2021-09-22: 3 g via INTRAVENOUS

## 2021-09-22 MED ORDER — CHLORHEXIDINE GLUCONATE 0.12 % MT SOLN
15.0000 mL | Freq: Once | OROMUCOSAL | Status: AC
  Administered 2021-09-22: 15 mL via OROMUCOSAL

## 2021-09-22 MED ORDER — FENTANYL CITRATE PF 50 MCG/ML IJ SOSY: 25.0000 ug | PREFILLED_SYRINGE | INTRAMUSCULAR | Status: DC | PRN

## 2021-09-22 MED ORDER — MIDAZOLAM HCL 2 MG/2ML IJ SOLN
INTRAMUSCULAR | Status: AC
  Filled 2021-09-22: qty 2

## 2021-09-22 MED ORDER — DEXAMETHASONE SODIUM PHOSPHATE 10 MG/ML IJ SOLN
INTRAMUSCULAR | Status: AC
  Filled 2021-09-22: qty 1

## 2021-09-22 MED ORDER — FENTANYL CITRATE (PF) 100 MCG/2ML IJ SOLN
INTRAMUSCULAR | Status: AC
  Filled 2021-09-22: qty 2

## 2021-09-22 MED ORDER — EPINEPHRINE PF 1 MG/ML IJ SOLN
INTRAMUSCULAR | Status: DC | PRN
  Administered 2021-09-22: 1 mg via SUBCUTANEOUS

## 2021-09-22 MED ORDER — BUPIVACAINE-EPINEPHRINE 0.5% -1:200000 IJ SOLN
INTRAMUSCULAR | Status: AC
  Filled 2021-09-22: qty 1

## 2021-09-22 MED ORDER — DOCUSATE SODIUM 100 MG PO CAPS: 100.0000 mg | ORAL_CAPSULE | Freq: Two times a day (BID) | ORAL | 1 refills | Status: DC | PRN

## 2021-09-22 MED ORDER — PROPOFOL 10 MG/ML IV BOLUS
INTRAVENOUS | Status: DC | PRN
  Administered 2021-09-22: 140 mg via INTRAVENOUS

## 2021-09-22 MED ORDER — PHENYLEPHRINE HCL-NACL 20-0.9 MG/250ML-% IV SOLN
INTRAVENOUS | Status: DC | PRN
  Administered 2021-09-22: 60 ug/min via INTRAVENOUS

## 2021-09-22 MED ORDER — MIDAZOLAM HCL 2 MG/2ML IJ SOLN
2.0000 mg | Freq: Once | INTRAMUSCULAR | Status: AC
  Administered 2021-09-22: 2 mg via INTRAVENOUS
  Filled 2021-09-22: qty 2

## 2021-09-22 MED ORDER — ONDANSETRON HCL 4 MG/2ML IJ SOLN
INTRAMUSCULAR | Status: AC
  Filled 2021-09-22: qty 2

## 2021-09-22 MED ORDER — HYDROCODONE-ACETAMINOPHEN 5-325 MG PO TABS: 1.0000 | ORAL_TABLET | ORAL | 0 refills | Status: AC | PRN

## 2021-09-22 MED ORDER — LABETALOL HCL 5 MG/ML IV SOLN
5.0000 mg | INTRAVENOUS | Status: DC | PRN
  Administered 2021-09-22: 5 mg via INTRAVENOUS

## 2021-09-22 MED ORDER — ACETAMINOPHEN 10 MG/ML IV SOLN: 1000.0000 mg | Freq: Once | INTRAVENOUS | Status: DC | PRN

## 2021-09-22 MED ORDER — ONDANSETRON HCL 4 MG/2ML IJ SOLN
INTRAMUSCULAR | Status: DC | PRN
  Administered 2021-09-22: 4 mg via INTRAVENOUS

## 2021-09-22 MED ORDER — FENTANYL CITRATE PF 50 MCG/ML IJ SOSY
50.0000 ug | PREFILLED_SYRINGE | INTRAMUSCULAR | Status: DC
  Filled 2021-09-22: qty 2

## 2021-09-22 MED ORDER — PHENYLEPHRINE HCL (PRESSORS) 10 MG/ML IV SOLN
INTRAVENOUS | Status: DC | PRN
  Administered 2021-09-22: 160 ug via INTRAVENOUS
  Administered 2021-09-22: 80 ug via INTRAVENOUS
  Administered 2021-09-22: 160 ug via INTRAVENOUS

## 2021-09-22 MED ORDER — IBUPROFEN 800 MG PO TABS: 800.0000 mg | ORAL_TABLET | Freq: Three times a day (TID) | ORAL | 1 refills | Status: DC | PRN

## 2021-09-22 MED ORDER — SODIUM CHLORIDE 0.9 % IR SOLN
Status: DC | PRN
  Administered 2021-09-22: 6000 mL

## 2021-09-22 MED ORDER — LACTATED RINGERS IV SOLN: INTRAVENOUS | Status: DC

## 2021-09-22 MED ORDER — LIDOCAINE HCL (CARDIAC) PF 100 MG/5ML IV SOSY
PREFILLED_SYRINGE | INTRAVENOUS | Status: DC | PRN
  Administered 2021-09-22: 60 mg via INTRAVENOUS

## 2021-09-22 MED ORDER — EPHEDRINE SULFATE (PRESSORS) 50 MG/ML IJ SOLN
INTRAMUSCULAR | Status: DC | PRN
  Administered 2021-09-22: 10 mg via INTRAVENOUS

## 2021-09-22 MED ORDER — ROCURONIUM BROMIDE 100 MG/10ML IV SOLN
INTRAVENOUS | Status: DC | PRN
  Administered 2021-09-22: 70 mg via INTRAVENOUS

## 2021-09-22 MED ORDER — LIDOCAINE HCL (PF) 2 % IJ SOLN
INTRAMUSCULAR | Status: AC
  Filled 2021-09-22: qty 5

## 2021-09-22 MED ORDER — PHENYLEPHRINE HCL-NACL 20-0.9 MG/250ML-% IV SOLN
INTRAVENOUS | Status: AC
  Filled 2021-09-22: qty 250

## 2021-09-22 MED ORDER — ORAL CARE MOUTH RINSE: 15.0000 mL | Freq: Once | OROMUCOSAL | Status: AC

## 2021-09-22 MED ORDER — CEFAZOLIN IN SODIUM CHLORIDE 3-0.9 GM/100ML-% IV SOLN
3.0000 g | INTRAVENOUS | Status: DC
  Filled 2021-09-22: qty 100

## 2021-09-22 MED ORDER — BUPIVACAINE-EPINEPHRINE 0.5% -1:200000 IJ SOLN
INTRAMUSCULAR | Status: DC | PRN
  Administered 2021-09-22: 50 mL

## 2021-09-22 MED ORDER — FENTANYL CITRATE (PF) 100 MCG/2ML IJ SOLN
INTRAMUSCULAR | Status: DC | PRN
  Administered 2021-09-22 (×2): 50 ug via INTRAVENOUS

## 2021-09-22 MED ORDER — EPINEPHRINE PF 1 MG/ML IJ SOLN
INTRAMUSCULAR | Status: AC
  Filled 2021-09-22: qty 1

## 2021-09-22 MED ORDER — DEXAMETHASONE SODIUM PHOSPHATE 10 MG/ML IJ SOLN
INTRAMUSCULAR | Status: DC | PRN
  Administered 2021-09-22: 8 mg via INTRAVENOUS

## 2021-09-22 MED ORDER — SUGAMMADEX SODIUM 200 MG/2ML IV SOLN
INTRAVENOUS | Status: DC | PRN
  Administered 2021-09-22: 200 mg via INTRAVENOUS

## 2021-09-22 MED ORDER — ROCURONIUM BROMIDE 10 MG/ML (PF) SYRINGE
PREFILLED_SYRINGE | INTRAVENOUS | Status: AC
  Filled 2021-09-22: qty 10

## 2021-09-22 MED ORDER — OXYCODONE HCL 5 MG PO TABS: 5.0000 mg | ORAL_TABLET | ORAL | 0 refills | Status: AC | PRN

## 2021-09-22 MED ORDER — LABETALOL HCL 5 MG/ML IV SOLN
INTRAVENOUS | Status: AC
  Filled 2021-09-22: qty 4

## 2021-09-22 SURGICAL SUPPLY — 36 items
BAG COUNTER SPONGE SURGICOUNT (BAG) IMPLANT
BLADE SHAVER TORPEDO 4X13 (MISCELLANEOUS) ×1 IMPLANT
BLADE SURG SZ11 CARB STEEL (BLADE) IMPLANT
BNDG ELASTIC 6X5.8 VLCR STR LF (GAUZE/BANDAGES/DRESSINGS) IMPLANT
BOOTIES KNEE HIGH SLOAN (MISCELLANEOUS) ×2 IMPLANT
CANNULA ACUFLEX KIT 5X76 (CANNULA) ×1 IMPLANT
COVER SURGICAL LIGHT HANDLE (MISCELLANEOUS) ×1 IMPLANT
DISSECTOR 3.5MM X 13CM (MISCELLANEOUS) IMPLANT
DISSECTOR 3.5MM X 13CM CVD (MISCELLANEOUS) IMPLANT
DISSECTOR 4.0MMX13CM CVD (MISCELLANEOUS) IMPLANT
DRSG EMULSION OIL 3X3 NADH (GAUZE/BANDAGES/DRESSINGS) ×1 IMPLANT
DURAPREP 26ML APPLICATOR (WOUND CARE) ×1 IMPLANT
DW OUTFLOW CASSETTE/TUBE SET (MISCELLANEOUS) ×1 IMPLANT
ELECT MENISCUS 165MM 90D (ELECTRODE) IMPLANT
GAUZE PAD ABD 8X10 STRL (GAUZE/BANDAGES/DRESSINGS) IMPLANT
GLOVE BIOGEL PI IND STRL 7.0 (GLOVE) ×1 IMPLANT
GLOVE BIOGEL PI IND STRL 8 (GLOVE) ×1 IMPLANT
GLOVE SURG POLYISO LF SZ7.5 (GLOVE) ×2 IMPLANT
GLOVE SURG SS PI 8.0 STRL IVOR (GLOVE) ×2 IMPLANT
GOWN STRL REUS W/ TWL XL LVL3 (GOWN DISPOSABLE) ×2 IMPLANT
GOWN STRL REUS W/TWL XL LVL3 (GOWN DISPOSABLE) ×2
KIT BASIN OR (CUSTOM PROCEDURE TRAY) IMPLANT
KIT TURNOVER KIT A (KITS) IMPLANT
MANIFOLD NEPTUNE II (INSTRUMENTS) ×2 IMPLANT
PACK ARTHROSCOPY WL (CUSTOM PROCEDURE TRAY) ×1 IMPLANT
PADDING CAST COTTON 6X4 ST (SOFTGOODS) IMPLANT
PADDING CAST COTTON 6X4 STRL (CAST SUPPLIES) ×1 IMPLANT
PENCIL SMOKE EVACUATOR (MISCELLANEOUS) IMPLANT
PORT APPOLLO RF 90DEGREE MULTI (SURGICAL WAND) IMPLANT
PROBE BIPOLAR ATHRO 135MM 90D (MISCELLANEOUS) IMPLANT
SUT ETHILON 4 0 PS 2 18 (SUTURE) ×1 IMPLANT
TOWEL OR 17X26 10 PK STRL BLUE (TOWEL DISPOSABLE) ×1 IMPLANT
TUBING ARTHROSCOPY IRRIG 16FT (MISCELLANEOUS) ×1 IMPLANT
WAND APOLLORF SJ50 AR-9845 (SURGICAL WAND) ×1 IMPLANT
WIPE CHG 2% 2PK PREOPERATIVE (MISCELLANEOUS) ×1 IMPLANT
WRAP KNEE MAXI GEL POST OP (GAUZE/BANDAGES/DRESSINGS) ×1 IMPLANT

## 2021-09-22 NOTE — Brief Op Note (Signed)
09/22/2021  10:48 AM  PATIENT:  Sean Norman  66 y.o. male  PRE-OPERATIVE DIAGNOSIS:  Left knee medial and lateral meniscus tears  POST-OPERATIVE DIAGNOSIS:  * No post-op diagnosis entered *  PROCEDURE:  Procedure(s): KNEE ARTHROSCOPY WITH MEDIAL AND LATERAL MENISECTOMY AND DEBRIDEMENT (Left)  SURGEON:  Surgeon(s) and Role:    Jene Every, MD - Primary  PHYSICIAN ASSISTANT:   ASSISTANTS: Bissell   ANESTHESIA:   spinal  EBL:  min   BLOOD ADMINISTERED:none  DRAINS: none   LOCAL MEDICATIONS USED:  MARCAINE     SPECIMEN:  No Specimen  DISPOSITION OF SPECIMEN:  N/A  COUNTS:  YES  TOURNIQUET:  * No tourniquets in log *  DICTATION: .Other Dictation: Dictation Number [id number]  PLAN OF CARE: Discharge to home after PACU  PATIENT DISPOSITION:  PACU - hemodynamically stable.   Delay start of Pharmacological VTE agent (>24hrs) due to surgical blood loss or risk of bleeding: no

## 2021-09-22 NOTE — Op Note (Signed)
NAMECAILLOU, MINUS MEDICAL RECORD NO: [id number] ACCOUNT NO: [id number] DATE OF BIRTH: [birth year] FACILITY: [facility] LOCATION: [facility] PHYSICIAN: Javier Docker, MD  Operative Report   DATE OF PROCEDURE: 09/22/2021  PREOPERATIVE DIAGNOSIS:  Medial and lateral meniscus tears, posttraumatic left knee.  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  PROCEDURE PERFORMED:   1.  Left knee arthroscopy. 2.  Partial medial and lateral meniscectomies. 3.  Chondroplasty of medial femoral condyle, patellofemoral sulcus, medial tibial plateau, lateral tibial plateau.  HISTORY:  A 66 year old with locking, popping, giving way of the knee.  MRI indicating medial and lateral meniscus tears.  Refractory to conservative treatment, indicated for knee arthroscopy, partial meniscectomies.  Risks and benefits discussed  including bleeding, infection, damage to neurovascular structures, no change in symptoms, worsening symptoms, DVT, PE, anesthetic complications, etc.  TECHNIQUE:  With the patient in supine position, after induction of adequate general anesthesia, 3 grams Kefzol, left lower extremity was prepped and draped in the usual sterile fashion.  A lateral parapatellar portal was fashioned with a #11 blade.   Ingress cannula atraumatically placed.  Irrigant was utilized to insufflate the joint at 35 mmHg.  Under direct visualization, a medial parapatellar portal was fashioned with a #11 blade after localization with 18-gauge needle, sparing the medial  meniscus.  Noted was extensive grade 3 changes of the medial compartment.  Light chondroplasty performed of femoral condyle and tibial plateau.  No grade IV changes.  Complex tear of the posterior third of the medial meniscus.  I introduced a straight  upbiting basket to resect approximately one-third of the  posterior third of the medial meniscus.  Further contoured with an ArthroWand.  The remnant was stable to probe palpation.  The anterior and posterior roots were intact.  Next, evaluation of the suprapatellar pouch revealed extensive grade III and 50% grade IV changes of the patella.  Light chondroplasty performed of the patella.  There was normal patellofemoral tracking.  There were grade III changes of the central  sulcus and light chondroplasty performed here as well.  Gutters are unremarkable.  Lateral compartment revealed a tear of the anterior horn of the lateral meniscus.  Approximately 25% of the thickness.  This was debrided to a stable base with a shaver.  The remainder was intact.  There was some radial tears of the posterior third of  the lateral meniscus.  This was resected to a stable base with a shaver.  There was some grade 3 change of the tibial plateau, light chondroplasty performed here.  Remnant meniscus stable to probe palpation.  I revisited all compartments, no further pathology amenable to arthroscopic intervention.  Electrocautery was utilized to achieve hemostasis around the fat pad, which was debrided initially.  ACL was unremarkable.  No further pathology amenable to  arthroscopic intervention, I, therefore, removed all instrumentation.  Portals were closed with 4-0 nylon simple sutures.  0.25% Marcaine with epinephrine was infiltrated in joint.  Wound was dressed sterilely.  Awoken without difficulty and transported  to the recovery room in satisfactory condition.  The patient tolerated the procedure well.  No complications.   Assistant, Andrez Grime, PA was used throughout the case for patient positioning and hold the leg of this gentleman who has a BMI of 40, and a weight of 140 kilograms to gain access to the lateral compartment, which was tight to gain access to it,  closure and dressing.   CHR D: 09/22/2021 12:49:57 pm T: 09/22/2021 2:05:00 pm  JOB: [id number]/  [id number]

## 2021-09-22 NOTE — Anesthesia Procedure Notes (Signed)
Procedure Name: Intubation Date/Time: 09/22/2021 11:49 AM  Performed by: Kalon Erhardt, Forest Gleason, CRNAPre-anesthesia Checklist: Patient identified, Emergency Drugs available, Suction available, Patient being monitored and Timeout performed Patient Re-evaluated:Patient Re-evaluated prior to induction Oxygen Delivery Method: Circle system utilized Preoxygenation: Pre-oxygenation with 100% oxygen Induction Type: IV induction Ventilation: Mask ventilation without difficulty Laryngoscope Size: Mac and 4 Grade View: Grade I Tube type: Oral Tube size: 7.5 mm Number of attempts: 1 Airway Equipment and Method: Stylet Placement Confirmation: ETT inserted through vocal cords under direct vision, positive ETCO2, CO2 detector and breath sounds checked- equal and bilateral Secured at: 22 cm Tube secured with: Tape Dental Injury: Teeth and Oropharynx as per pre-operative assessment

## 2021-09-22 NOTE — Anesthesia Preprocedure Evaluation (Addendum)
Anesthesia Evaluation  Patient identified by MRN, date of birth, ID band Patient awake    Reviewed: Allergy & Precautions, NPO status , Patient's Chart, lab work & pertinent test results  Airway Mallampati: III  TM Distance: >3 FB     Dental no notable dental hx.    Pulmonary neg pulmonary ROS,    Pulmonary exam normal        Cardiovascular hypertension, Pt. on medications and Pt. on home beta blockers + CAD and +CHF   Rhythm:Regular Rate:Normal     Neuro/Psych negative neurological ROS  negative psych ROS   GI/Hepatic negative GI ROS, Neg liver ROS,   Endo/Other  Morbid obesity  Renal/GU negative Renal ROS  negative genitourinary   Musculoskeletal Meniscal tear   Abdominal Normal abdominal exam  (+)   Peds  Hematology negative hematology ROS (+) Lab Results      Component                Value               Date                      WBC                      3.9 (L)             09/09/2021                HGB                      14.8                09/09/2021                HCT                      46.0                09/09/2021                MCV                      92.7                09/09/2021                PLT                      233                 09/09/2021           Lab Results      Component                Value               Date                      NA                       139                 09/09/2021                K  3.8                 09/09/2021                CO2                      27                  09/09/2021                GLUCOSE                  97                  09/09/2021                BUN                      13                  09/09/2021                CREATININE               0.95                09/09/2021                CALCIUM                  8.8 (L)             09/09/2021                EGFR                     83                  08/19/2021                 GFRNONAA                 >60                 09/09/2021             Anesthesia Other Findings   Reproductive/Obstetrics                            Anesthesia Physical Anesthesia Plan  ASA: 4  Anesthesia Plan: General   Post-op Pain Management:    Induction: Intravenous  PONV Risk Score and Plan: 2 and Ondansetron, Dexamethasone and Treatment may vary due to age or medical condition  Airway Management Planned: Mask and Oral ETT  Additional Equipment: None  Intra-op Plan:   Post-operative Plan: Extubation in OR  Informed Consent: I have reviewed the patients History and Physical, chart, labs and discussed the procedure including the risks, benefits and alternatives for the proposed anesthesia with the patient or authorized representative who has indicated his/her understanding and acceptance.     Dental advisory given  Plan Discussed with: CRNA  Anesthesia Plan Comments: (CV: Exercise Sestamibi stress test 06/21/2021: Exercise nuclear stress test was performed using Bruce protocol. Patient reached 7 METS, and 87% of age predicted maximum heart rate. Exercise capacity was low. No chest pain reported. Normal heart rate response. Hypertensive response with peak BP 210/108 mmHg. Stress EKG revealed no ischemic changes. Dilated   LV cavity with severe global decrease in wall motion and myocardial thickening. Stress LVEF 20%. Decreased tracer uptake in inferior myocardium at both rest and stress, likely due to diaphragmatic attenuation. 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  High risk study due to reduced LVEF.   Echocardiogram 04/13/2021:  Left ventricle cavity is mildly dilated. Normal left ventricular wall  thickness. Moderate global hypokinesis. LVEF 30-35%. Doppler evidence of  grade I (impaired) diastolic dysfunction, normal LAP.  Left atrial cavity is mildly dilated. 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  The aortic root is normal. Mildly dilated ascending aorta at 3.9 cm.  Estimated right atrial pressure 8 mmHg.)       Anesthesia Quick Evaluation  

## 2021-09-22 NOTE — Interval H&P Note (Signed)
History and Physical Interval Note:  09/22/2021 10:48 AM  Sean Norman  has presented today for surgery, with the diagnosis of Left knee medial and lateral meniscus tears.  The various methods of treatment have been discussed with the patient and family. After consideration of risks, benefits and other options for treatment, the patient has consented to  Procedure(s): KNEE ARTHROSCOPY WITH MEDIAL AND LATERAL MENISECTOMY AND DEBRIDEMENT (Left) as a surgical intervention.  The patient's history has been reviewed, patient examined, no change in status, stable for surgery.  I have reviewed the patient's chart and labs.  Questions were answered to the patient's satisfaction.     Lilac Hoff C Tzivia Oneil   

## 2021-09-22 NOTE — Interval H&P Note (Signed)
History and Physical Interval Note:  09/22/2021 10:48 AM  Sean Norman  has presented today for surgery, with the diagnosis of Left knee medial and lateral meniscus tears.  The various methods of treatment have been discussed with the patient and family. After consideration of risks, benefits and other options for treatment, the patient has consented to  Procedure(s): KNEE ARTHROSCOPY WITH MEDIAL AND LATERAL MENISECTOMY AND DEBRIDEMENT (Left) as a surgical intervention.  The patient's history has been reviewed, patient examined, no change in status, stable for surgery.  I have reviewed the patient's chart and labs.  Questions were answered to the patient's satisfaction.     Javier Docker

## 2021-09-22 NOTE — Transfer of Care (Signed)
Immediate Anesthesia Transfer of Care Note  Patient: Sean Norman  Procedure(s) Performed: KNEE ARTHROSCOPY WITH MEDIAL AND LATERAL MENISECTOMY AND DEBRIDEMENT (Left: Knee)  Patient Location: PACU  Anesthesia Type:General  Level of Consciousness: awake  Airway & Oxygen Therapy: Patient Spontanous Breathing  Post-op Assessment: Report given to RN  Post vital signs: stable  Last Vitals:  Vitals Value Taken Time  BP 152/103 09/22/21 1300  Temp 36.7 C 09/22/21 1256  Pulse 87 09/22/21 1308  Resp 20 09/22/21 1308  SpO2 92 % 09/22/21 1308  Vitals shown include unvalidated device data.  Last Pain:  Vitals:   09/22/21 1256  TempSrc:   PainSc: 0-No pain         Complications: No notable events documented.

## 2021-09-22 NOTE — Discharge Instructions (Signed)
ARTHROSCOPIC KNEE SURGERY HOME CARE INSTRUCTIONS   PAIN You will be expected to have a moderate amount of pain in the affected knee for approximately two weeks.  However, the first two to four days will be the most severe in terms of the pain you will experience.  Prescriptions have been provided for you to take as needed for the pain.  The pain can be markedly reduced by using the ice/compressive bandage given.  Exchange the ice packs whenever they thaw.  During the night, keep the bandage on because it will still provide some compression for the swelling.  Also, keep the leg elevated on pillows above your heart, and this will help alleviate the pain and swelling.  MEDICATION Prescriptions have been provided to take as needed for pain. To prevent blood clots, take Aspirin 325mg daily with a meal if not on a blood thinner and if no history of stomach ulcers.  ACTIVITY It is preferred that you stay on bedrest for approximately 24 hours.  However, you may go to the bathroom with help.  After this, you can start to be up and about progressively more.  Remember that the swelling may still increase after three to four days if you are up and doing too much.  You may put as much weight on the affected leg as pain will allow.  Use your crutches for comfort and safety.  However, as soon as you are able, you may discard the crutches and go without them.   DRESSING Keep the current dressing as dry as possible.  Two days after your surgery, you may remove the ice/compressive wrap, and surgical dressing.  You may now take a shower, but do not scrub the sounds directly with soap.  Let water rinse over these and gently wipe with your hand.  Reapply band-aids over the puncture wounds and more gauze if needed.  A slight amount of thin drainage can be normal at this time, and do not let it frighten you.  Reapply the ice/compressive wrap.  You may now repeat this every day each time you shower.  SYMPTOMS TO REPORT TO  YOUR DOCTOR  -Extreme pain.  -Extreme swelling.  -Temperature above 101 degrees that does not come down with acetaminophen     (Tylenol).  -Any changes in the feeling, color or movement of your toes.  -Extreme redness, heat, swelling or drainage at your incision  EXERCISE It is preferred that you begin to exercise on the day of your surgery.  Straight leg raises and short arc quads should be begun the afternoon or evening of surgery and continued until you come back for your follow-up appointment.   Attached is an instruction sheet on how to perform these two simple exercises.  Do these at least three times per day if not more.  You may bend your knee as much as is comfortable.  The puncture wounds may occasionally be slightly uncomfortable with bending of the knee.  Do not let this frighten you.  It is important to keep your knee motion, but do not overdo it.  If you have significant pain, simply do not bend the knee as far.   You will be given more exercises to perform at your first return visit.    RETURN APPOINTMENT Please make an appointment to be seen by your doctor in 10-14 days from your surgery.  Patient Signature:  ________________________________________________________  Nurse's Signature:  ________________________________________________________ 

## 2021-09-22 NOTE — Progress Notes (Signed)
Orthopedic Tech Progress Note Patient Details:  Sean Norman 12-31-1955 [id number]  Patient ID: Sean Norman, male   DOB: 11/25/1955, 66 y.o.   MRN: [id number]  Sean Norman 09/22/2021, 1:41 PM Crutches sized and delivered to patient in pacu.

## 2021-09-23 ENCOUNTER — Encounter (HOSPITAL_COMMUNITY): Payer: Self-pay | Admitting: Specialist

## 2021-09-23 NOTE — Anesthesia Postprocedure Evaluation (Signed)
Anesthesia Post Note  Patient: Anne Sebring  Procedure(s) Performed: KNEE ARTHROSCOPY WITH MEDIAL AND LATERAL MENISECTOMY AND DEBRIDEMENT (Left: Knee)     Patient location during evaluation: PACU Anesthesia Type: General Level of consciousness: awake and alert Pain management: pain level controlled Vital Signs Assessment: post-procedure vital signs reviewed and stable Respiratory status: spontaneous breathing, nonlabored ventilation, respiratory function stable and patient connected to nasal cannula oxygen Cardiovascular status: blood pressure returned to baseline and stable Postop Assessment: no apparent nausea or vomiting Anesthetic complications: no   No notable events documented.  Last Vitals:  Vitals:   09/22/21 1340 09/22/21 1400  BP: (!) 179/106 (!) 142/87  Pulse: 80 84  Resp: 16   Temp: 36.7 C   SpO2: 96%     Last Pain:  Vitals:   09/22/21 1340  TempSrc:   PainSc: 0-No pain                 Earl Lites P Jakavion Bilodeau

## 2021-09-29 ENCOUNTER — Encounter: Payer: Self-pay | Admitting: Cardiology

## 2021-09-29 ENCOUNTER — Ambulatory Visit: Payer: BC Managed Care – PPO | Admitting: Cardiology

## 2021-09-29 VITALS — BP 146/86 | HR 58 | Temp 98.1°F | Resp 16 | Ht 74.0 in | Wt 310.6 lb

## 2021-09-29 DIAGNOSIS — E7800 Pure hypercholesterolemia, unspecified: Secondary | ICD-10-CM | POA: Diagnosis not present

## 2021-09-29 DIAGNOSIS — I2510 Atherosclerotic heart disease of native coronary artery without angina pectoris: Secondary | ICD-10-CM | POA: Diagnosis not present

## 2021-09-29 DIAGNOSIS — E6609 Other obesity due to excess calories: Secondary | ICD-10-CM

## 2021-09-29 DIAGNOSIS — I428 Other cardiomyopathies: Secondary | ICD-10-CM

## 2021-09-29 DIAGNOSIS — I5042 Chronic combined systolic (congestive) and diastolic (congestive) heart failure: Secondary | ICD-10-CM

## 2021-09-29 DIAGNOSIS — I10 Essential (primary) hypertension: Secondary | ICD-10-CM

## 2021-09-29 MED ORDER — SPIRONOLACTONE 25 MG PO TABS: 25.0000 mg | ORAL_TABLET | Freq: Every morning | ORAL | 0 refills | Status: DC

## 2021-09-29 NOTE — Progress Notes (Signed)
Date:  09/29/2021   ID:  Sean Norman, DOB 10/04/1955, MRN [id number]  PCP:  Lorenda Ishihara, MD  Cardiologist:  Tessa Lerner, DO, [facility] (established care March 31, 2021)  Date: 09/29/21 Last Office Visit: 08/10/2021  Chief Complaint  Patient presents with    heart failure management   Follow-up    HPI  Sean Norman is a 66 y.o. African-American male whose past medical history and cardiovascular risk factors include: Nonischemic cardiomyopathy, mild CAC, hypercholesterolemia, hypertension, family history of CAD/CVA, vitiligo, obesity due to excess calories.   Initially referred to the practice for hypertensive heart disease.  Echo noted LVEF of 30-35%, mildly dilated LV cavity, no significant valvular heart disease.  Exercise nuclear stress test reported to be high risk and therefore, underwent coronary CTA which notes mild coronary artery calcification and no obstructive CAD.    Since last office visit he has successfully undergone knee arthroscopy and is recovering well.  He has lost 6 pounds with lifestyle changes and his home blood pressures are now better controlled around 140 mmHg.  His dyspnea on exertion remains relatively stable.  He is scheduled to have a sleep study on October 12, 2021.  He denies anginal discomfort at rest or with effort related activities.  FUNCTIONAL STATUS: Tries to walk 12 acres of his property twice a week.  ALLERGIES: No Known Allergies  MEDICATION LIST PRIOR TO VISIT: Current Meds  Medication Sig   aspirin EC 81 MG tablet Take 1 tablet (81 mg total) by mouth daily. Swallow whole.   atorvastatin (LIPITOR) 40 MG tablet Take 1 tablet (40 mg total) by mouth at bedtime.   dapagliflozin propanediol (FARXIGA) 10 MG TABS tablet Take 1 tablet (10 mg total) by mouth daily before breakfast.   metoprolol succinate (TOPROL-XL) 50 MG 24 hr tablet TAKE 1 TABLET(50 MG) BY MOUTH EVERY MORNING   sacubitril-valsartan (ENTRESTO) 97-103 MG Take 1 tablet by mouth  2 (two) times daily.   spironolactone (ALDACTONE) 25 MG tablet Take 1 tablet (25 mg total) by mouth every morning.     PAST MEDICAL HISTORY: Past Medical History:  Diagnosis Date   Allergic rhinitis 05/02/2019   Cardiomyopathy (HCC)    CHF (congestive heart failure) (HCC)    Chicken pox    Coronary artery calcification    Hyperlipidemia    Hypertension    Vitiligo     PAST SURGICAL HISTORY: Past Surgical History:  Procedure Laterality Date   COLONOSCOPY  2014   in [place]-polyp per pt   KNEE ARTHROSCOPY WITH LATERAL MENISECTOMY Left 09/22/2021   Procedure: KNEE ARTHROSCOPY WITH MEDIAL AND LATERAL MENISECTOMY AND DEBRIDEMENT;  Surgeon: Jene Every, MD;  Location: [facility];  Service: Orthopedics;  Laterality: Left;   NO PAST SURGERIES     POLYPECTOMY      FAMILY HISTORY: The patient family history includes Breast cancer in his maternal grandmother; Diabetes in his mother and paternal grandfather; Heart disease in his father, maternal grandfather, maternal grandmother, and mother; Hypertension in his father, maternal grandfather, maternal grandmother, and mother; Kidney disease in his mother; Stroke in his father and maternal grandfather.  SOCIAL HISTORY:  The patient  reports that he has never smoked. He has never used smokeless tobacco. He reports that he does not drink alcohol and does not use drugs.  REVIEW OF SYSTEMS: Review of Systems  Constitutional: Positive for weight loss.  Cardiovascular:  Positive for dyspnea on exertion (Improving) and leg swelling (Improving). Negative for chest pain, palpitations and syncope.  PHYSICAL EXAM:    09/29/2021   11:06 AM 09/22/2021    2:00 PM 09/22/2021    1:40 PM  Vitals with BMI  Height 6\' 2"     Weight 310 lbs 10 oz    BMI 32.20    Systolic 254 270 623  Diastolic 86 87 762  Pulse 58 84 80    CONSTITUTIONAL: Well-developed and well-nourished. No acute distress.  SKIN: Skin is warm and dry. No rash noted. No  cyanosis. No pallor. No jaundice.  vitiligo. HEAD: Normocephalic and atraumatic.  EYES: No scleral icterus MOUTH/THROAT: Moist oral membranes.  NECK: No JVD present. No thyromegaly noted. No carotid bruits  CHEST Normal respiratory effort. No intercostal retractions  LUNGS: Clear to auscultation bilaterally.  No stridor. No wheezes. No rales.  CARDIOVASCULAR: Regular rate and rhythm, positive S1-S2, no murmurs rubs or gallops appreciated. ABDOMINAL: Soft, nontender, nondistended, positive bowel sounds in all 4 quadrants, no apparent ascites.  EXTREMITIES: +1 bilateral pitting edema up to mid shin.  warm to touch, 2+ bilateral DP and PT pulses HEMATOLOGIC: No significant bruising NEUROLOGIC: Oriented to person, place, and time. Nonfocal. Normal muscle tone.  PSYCHIATRIC: Normal mood and affect. Normal behavior. Cooperative  CARDIAC DATABASE: EKG: 03/31/2021: NSR, 77 bpm, poor R wave progression, LVH with voltage criteria. 07/06/2021: Normal sinus rhythm, 83 bpm, left axis, poor R wave progression, without underlying injury pattern.   Echocardiogram: 04/13/2021:  Left ventricle cavity is mildly dilated. Normal left ventricular wall thickness. Moderate global hypokinesis. LVEF 30-35%. Doppler evidence of grade I (impaired) diastolic dysfunction, normal LAP.  Left atrial cavity is mildly dilated. Aneurysmal interatrial septum  without 2D or color Doppler evidence of shunting.  The aortic root is normal. Mildly dilated ascending aorta at 3.9 cm.  Estimated right atrial pressure 8 mmHg.   Stress Testing: Exercise Sestamibi stress test 06/21/2021: Exercise nuclear stress test was performed using Bruce protocol. Patient reached 7 METS, and 87% of age predicted maximum heart rate. Exercise capacity was low. No chest pain reported. Normal heart rate response. Hypertensive response with peak BP 210/108 mmHg. Stress EKG revealed no ischemic changes. Dilated LV cavity with severe global decrease in wall  motion and myocardial thickening. Stress LVEF 20%. Decreased tracer uptake in inferior myocardium at both rest and stress, likely due to diaphragmatic attenuation. 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  High risk study due to reduced LVEF.   Heart Catheterization: None  CCTA 08/03/2021: 1. Total coronary calcium score of 61.6. This was 71st percentile for age and sex matched control. 2. Normal coronary origin with left dominance. 3. CAD-RADS = 1. Left Main: Patent. LAD: Minimal plaque within proximal LAD otherwise patent. LCx: Patent RCA: Patent. 4. Stable small 3 mm left lower lobe pulmonary nodule which requires no follow-up. 5. No acute noncardiac cardiopulmonary finding.  LABORATORY DATA:  External Labs: Collected: February 18, 2021. Total cholesterol 233, triglycerides 99, HDL 45, LDL 171, non-HDL 189  Collected: March 17, 2021 provided by referring physician. Total cholesterol 174, triglycerides 72, HDL 41, LDL 120, non-HDL 134 Hemoglobin 14.3 g/dL, hematocrit 42.8% BUN 15, creatinine 1.04. Sodium 140, potassium 3.7, chloride 100, bicarb 34. AST 15, ALT 19, alkaline phosphatase 113     Latest Ref Rng & Units 09/09/2021    2:54 PM 08/19/2021   11:58 AM 07/27/2021    3:41 PM  CMP  Glucose 70 - 99 mg/dL 97  102  98   BUN 8 - 23 mg/dL 13  14  16  Creatinine 0.61 - 1.24 mg/dL 3.88  8.28  0.03   Sodium 135 - 145 mmol/L 139  142  142   Potassium 3.5 - 5.1 mmol/L 3.8  4.3  4.1   Chloride 98 - 111 mmol/L 105  99  104   CO2 22 - 32 mmol/L 27  25  25    Calcium 8.9 - 10.3 mg/dL 8.8  9.1  9.5   Total Protein 6.0 - 8.5 g/dL  7.1    Total Bilirubin 0.0 - 1.2 mg/dL  0.5    Alkaline Phos 44 - 121 IU/L  129    AST 0 - 40 IU/L  16    ALT 0 - 44 IU/L  17      Lipid Panel  Lab Results  Component Value Date   CHOL 122 08/19/2021   HDL 46 08/19/2021   LDLCALC 65 08/19/2021   LDLDIRECT 71 08/19/2021   TRIG 49 08/19/2021   CHOLHDL 4 06/20/2019    No  components found for: "NTPROBNP" Recent Labs    06/02/21 1203 07/05/21 1311 07/27/21 1541  PROBNP 72 <36 54   No results for input(s): "TSH" in the last 8760 hours.  BMP Recent Labs    07/27/21 1541 08/19/21 1158 09/09/21 1454  NA 142 142 139  K 4.1 4.3 3.8  CL 104 99 105  CO2 25 25 27   GLUCOSE 98 102* 97  BUN 16 14 13   CREATININE 0.99 1.01 0.95  CALCIUM 9.5 9.1 8.8*  GFRNONAA  --   --  >60    HEMOGLOBIN A1C Lab Results  Component Value Date   HGBA1C 6.4 06/20/2019    Cardiac Panel (last 3 results) No results for input(s): "CKTOTAL", "CKMB", "TROPONINIHS", "RELINDX" in the last 72 hours.  CHOLESTEROL Recent Labs    04/26/21 1122 08/19/21 1158  CHOL 142 122    Hepatic Function Panel Recent Labs    04/26/21 1121 08/19/21 1158  PROT 7.2 7.1  ALBUMIN 4.2 4.3  AST 14 16  ALT 19 17  ALKPHOS 126* 129*  BILITOT 0.5 0.5      IMPRESSION:    ICD-10-CM   1. Nonischemic cardiomyopathy (HCC)  I42.8 spironolactone (ALDACTONE) 25 MG tablet    Basic metabolic panel    Pro b natriuretic peptide (BNP)    Magnesium    2. Chronic combined systolic and diastolic heart failure (HCC)  10/19/21 spironolactone (ALDACTONE) 25 MG tablet    Basic metabolic panel    Pro b natriuretic peptide (BNP)    Magnesium    3. Coronary atherosclerosis due to calcified coronary lesion  I25.10    I25.84     4. Pure hypercholesterolemia  E78.00     5. Benign hypertension  I10     6. Class 2 obesity due to excess calories without serious comorbidity with body mass index (BMI) of 39.0 to 39.9 in adult  E66.09    Z68.39         RECOMMENDATIONS: Sean Norman is a 66 y.o. African-American male whose past medical history and cardiac risk factors include: Nonischemic cardiomyopathy, mild CAC, hypercholesterolemia, hypertension, family history of CAD/CVA, vitiligo, obesity due to excess calories.    Nonischemic cardiomyopathy (HCC) Likely secondary to hypertensive heart  disease. Since last visit he is now on the maximum dose of Entresto. Home blood pressure log and weights reviewed. Medications reconciled. Start spironolactone 25 mg p.o. a.m. labs in 1 week to evaluate kidney function and electrolytes. If needed BiDil can be started at  a later date as patient is not using her have access to PDE 5 inhibitors. Since last office visit he is now enrolled in Medicare and will enroll him to remote patient monitoring  Chronic combined systolic and diastolic heart failure (HCC) Stage B, NYHA class II. No hospitalizations for heart failure. We will continue to uptitrate GDMT. Management as discussed above.  Coronary atherosclerosis due to calcified coronary lesion Mild coronary artery calcification as of July 2023.  Continue aspirin and statin therapy. Minimal nonobstructive CAD. No additional work-up warranted at this time as he is currently asymptomatic.  Pure hypercholesterolemia Index LDL 171 mg/dL. Continue statin therapy. We will have to recheck lipids to reevaluate therapy-likely next visit  Benign hypertension Office and home blood pressures are improving. Medication changes as noted above. They have made a significant improvement with regards to reducing salt in her diet. We will monitoring her blood pressures via RPM.  Class 2 obesity due to excess calories without serious comorbidity with body mass index (BMI) of 39.0 to 39.9 in adult Has lost approximately 6 pounds since last office visit.  He is congratulated for his efforts. Body mass index is 39.88 kg/m. I reviewed with the patient the importance of diet, regular physical activity/exercise, weight loss.   Patient is educated on increasing physical activity gradually as tolerated.  With the goal of moderate intensity exercise for 30 minutes a day 5 days a week.   FINAL MEDICATION LIST END OF ENCOUNTER: Meds ordered this encounter  Medications   spironolactone (ALDACTONE) 25 MG tablet     Sig: Take 1 tablet (25 mg total) by mouth every morning.    Dispense:  30 tablet    Refill:  0    Medications Discontinued During This Encounter  Medication Reason   ibuprofen (ADVIL) 800 MG tablet    docusate sodium (COLACE) 100 MG capsule    docusate sodium (COLACE) 100 MG capsule     Current Outpatient Medications:    aspirin EC 81 MG tablet, Take 1 tablet (81 mg total) by mouth daily. Swallow whole., Disp: 30 tablet, Rfl: 12   atorvastatin (LIPITOR) 40 MG tablet, Take 1 tablet (40 mg total) by mouth at bedtime., Disp: 90 tablet, Rfl: 0   dapagliflozin propanediol (FARXIGA) 10 MG TABS tablet, Take 1 tablet (10 mg total) by mouth daily before breakfast., Disp: 90 tablet, Rfl: 0   metoprolol succinate (TOPROL-XL) 50 MG 24 hr tablet, TAKE 1 TABLET(50 MG) BY MOUTH EVERY MORNING, Disp: 30 tablet, Rfl: 0   sacubitril-valsartan (ENTRESTO) 97-103 MG, Take 1 tablet by mouth 2 (two) times daily., Disp: 180 tablet, Rfl: 0   spironolactone (ALDACTONE) 25 MG tablet, Take 1 tablet (25 mg total) by mouth every morning., Disp: 30 tablet, Rfl: 0  Orders Placed This Encounter  Procedures   Basic metabolic panel   Pro b natriuretic peptide (BNP)   Magnesium    There are no Patient Instructions on file for this visit.   --Continue cardiac medications as reconciled in final medication list. --Return in about 6 weeks (around 11/10/2021) for Follow up, heart failure management.. Or sooner if needed. --Continue follow-up with your primary care physician regarding the management of your other chronic comorbid conditions.  Patient's questions and concerns were addressed to his satisfaction. He voices understanding of the instructions provided during this encounter.   This note was created using a voice recognition software as a result there may be grammatical errors inadvertently enclosed that do not reflect the nature of this  encounter. Every attempt is made to correct such errors.  Tessa Lerner, [place],  [facility]  Pager: [phone] Office: [phone]

## 2021-10-06 ENCOUNTER — Telehealth: Payer: Self-pay

## 2021-10-06 ENCOUNTER — Other Ambulatory Visit: Payer: Self-pay | Admitting: Cardiology

## 2021-10-06 DIAGNOSIS — I428 Other cardiomyopathies: Secondary | ICD-10-CM | POA: Diagnosis not present

## 2021-10-06 DIAGNOSIS — I5042 Chronic combined systolic (congestive) and diastolic (congestive) heart failure: Secondary | ICD-10-CM | POA: Diagnosis not present

## 2021-10-06 NOTE — Telephone Encounter (Signed)
1-week follow-up from starting RPM home BP monitoring. Will continue to monitor.  Average Systolic BP Level 071.21 mmHg Lowest Systolic BP Level 975 mmHg Highest Systolic BP Level 883 mmHg  10/06/2021 Wednesday at 07:33 AM 128 / 95      10/06/2021 Wednesday at 07:27 AM 142 / 100      10/05/2021 Tuesday at 07:00 AM 139 / 99      10/05/2021 Tuesday at 06:52 AM 149 / 112      10/04/2021 Monday at 07:42 AM 138 / 104      10/04/2021 Monday at 01:23 AM 143 / 109      10/04/2021 Monday at 01:13 AM 148 / 110      10/03/2021 Sunday at 06:01 AM 147 / 108      10/02/2021 Saturday at 11:07 AM 148 / 109      10/02/2021 Saturday at 01:18 AM 147 / 106      09 /23/2023 Saturday at 01:12 AM 159 / 120

## 2021-10-07 LAB — MAGNESIUM: Magnesium: 2.1 mg/dL (ref 1.6–2.3)

## 2021-10-07 LAB — BASIC METABOLIC PANEL
BUN/Creatinine Ratio: 14 (ref 10–24)
BUN: 15 mg/dL (ref 8–27)
CO2: 26 mmol/L (ref 20–29)
Calcium: 9.8 mg/dL (ref 8.6–10.2)
Chloride: 102 mmol/L (ref 96–106)
Creatinine, Ser: 1.06 mg/dL (ref 0.76–1.27)
Glucose: 94 mg/dL (ref 70–99)
Potassium: 4.6 mmol/L (ref 3.5–5.2)
Sodium: 142 mmol/L (ref 134–144)
eGFR: 78 mL/min/{1.73_m2} (ref >59)

## 2021-10-07 LAB — PRO B NATRIURETIC PEPTIDE: NT-Pro BNP: 44 pg/mL (ref 0–376)

## 2021-10-08 NOTE — Progress Notes (Signed)
Tried calling patient no answer, unable to leave a vm

## 2021-10-08 NOTE — Telephone Encounter (Signed)
Will you look into Bidil coverage for him?   Dr. Terri Skains

## 2021-10-12 ENCOUNTER — Other Ambulatory Visit: Payer: Self-pay | Admitting: Cardiology

## 2021-10-12 DIAGNOSIS — I429 Cardiomyopathy, unspecified: Secondary | ICD-10-CM

## 2021-10-12 DIAGNOSIS — I5042 Chronic combined systolic (congestive) and diastolic (congestive) heart failure: Secondary | ICD-10-CM

## 2021-10-12 NOTE — Telephone Encounter (Signed)
Please continue to monitor his BP and if needed will need to start BiDil.  Find out which pharmacy covers / carries the generic BiDil in there plan.   Luvina Poirier [place], DO, [facility]

## 2021-10-13 DIAGNOSIS — G4733 Obstructive sleep apnea (adult) (pediatric): Secondary | ICD-10-CM | POA: Diagnosis not present

## 2021-10-13 DIAGNOSIS — I10 Essential (primary) hypertension: Secondary | ICD-10-CM | POA: Diagnosis not present

## 2021-10-15 DIAGNOSIS — I10 Essential (primary) hypertension: Secondary | ICD-10-CM | POA: Diagnosis not present

## 2021-10-15 NOTE — Progress Notes (Signed)
Called pt no answer could not leave a vm

## 2021-10-20 DIAGNOSIS — I10 Essential (primary) hypertension: Secondary | ICD-10-CM | POA: Diagnosis not present

## 2021-10-20 DIAGNOSIS — G4733 Obstructive sleep apnea (adult) (pediatric): Secondary | ICD-10-CM | POA: Diagnosis not present

## 2021-10-20 DIAGNOSIS — I119 Hypertensive heart disease without heart failure: Secondary | ICD-10-CM | POA: Diagnosis not present

## 2021-10-27 DIAGNOSIS — G4733 Obstructive sleep apnea (adult) (pediatric): Secondary | ICD-10-CM | POA: Diagnosis not present

## 2021-10-27 NOTE — Telephone Encounter (Signed)
Blood pressure looks well controlled. We will hold off on initiating BiDil at this time.  Sean Danford [place], DO, [facility]

## 2021-10-29 ENCOUNTER — Ambulatory Visit: Payer: Medicare Other | Admitting: Cardiology

## 2021-10-29 ENCOUNTER — Encounter: Payer: Self-pay | Admitting: Cardiology

## 2021-10-29 VITALS — BP 129/85 | HR 93 | Temp 97.7°F | Resp 16 | Ht 74.0 in | Wt 313.2 lb

## 2021-10-29 DIAGNOSIS — I10 Essential (primary) hypertension: Secondary | ICD-10-CM | POA: Diagnosis not present

## 2021-10-29 DIAGNOSIS — I2510 Atherosclerotic heart disease of native coronary artery without angina pectoris: Secondary | ICD-10-CM | POA: Diagnosis not present

## 2021-10-29 DIAGNOSIS — I428 Other cardiomyopathies: Secondary | ICD-10-CM

## 2021-10-29 DIAGNOSIS — E7800 Pure hypercholesterolemia, unspecified: Secondary | ICD-10-CM

## 2021-10-29 DIAGNOSIS — I5042 Chronic combined systolic (congestive) and diastolic (congestive) heart failure: Secondary | ICD-10-CM

## 2021-10-29 MED ORDER — DAPAGLIFLOZIN PROPANEDIOL 10 MG PO TABS: ORAL_TABLET | ORAL | 1 refills | Status: DC

## 2021-10-29 MED ORDER — SPIRONOLACTONE 25 MG PO TABS: 25.0000 mg | ORAL_TABLET | Freq: Every morning | ORAL | 1 refills | Status: DC

## 2021-10-29 MED ORDER — METOPROLOL SUCCINATE ER 50 MG PO TB24: ORAL_TABLET | ORAL | 1 refills | Status: DC

## 2021-10-29 NOTE — Progress Notes (Signed)
ID:  Aragorn Recker, DOB [birth year], MRN [id number]  PCP:  Leeroy Cha, MD  Cardiologist:  Rex Kras, DO, [facility] (established care March 31, 2021)  Date: 10/29/21 Last Office Visit: 09/29/2021  Chief Complaint  Patient presents with    heart failure management   Follow-up    HPI  Sean Norman is a 66 y.o. African-American male whose past medical history and cardiovascular risk factors include: Nonischemic cardiomyopathy, mild CAC, hypercholesterolemia, hypertension, family history of CAD/CVA, vitiligo, obesity due to excess calories.   Initially referred to the practice for hypertensive heart disease.  Echo noted LVEF of 30-35%, mildly dilated LV cavity, no significant valvular heart disease.  Exercise nuclear stress test reported to be high risk and therefore, underwent coronary CTA which notes mild coronary artery calcification and no obstructive CAD.    Given his underlying nonischemic cardiomyopathy patient has done well with up titration of GDMT.  He is enrolled into remote patient monitoring and he has been compliant.  His average blood pressure is 131/95 with an average heart rate of 71 bpm he is not having any episodes of lightheaded/dizziness/near syncope or syncope.  His volume status is well controlled.  He does not have any effort related symptoms of shortness of breath when compared to the past.  FUNCTIONAL STATUS: Tries to walk 12 acres of his property twice a week.  ALLERGIES: No Known Allergies  MEDICATION LIST PRIOR TO VISIT: Current Meds  Medication Sig   aspirin EC 81 MG tablet Take 1 tablet (81 mg total) by mouth daily. Swallow whole.   atorvastatin (LIPITOR) 40 MG tablet Take 1 tablet (40 mg total) by mouth at bedtime.   sacubitril-valsartan (ENTRESTO) 97-103 MG Take 1 tablet by mouth 2 (two) times daily.   [DISCONTINUED] FARXIGA 10 MG TABS tablet TAKE 1 TABLET(10 MG) BY MOUTH DAILY BEFORE BREAKFAST   [DISCONTINUED] metoprolol succinate (TOPROL-XL) 50  MG 24 hr tablet TAKE 1 TABLET(50 MG) BY MOUTH EVERY MORNING   [DISCONTINUED] spironolactone (ALDACTONE) 25 MG tablet Take 1 tablet (25 mg total) by mouth every morning.     PAST MEDICAL HISTORY: Past Medical History:  Diagnosis Date   Allergic rhinitis 05/02/2019   Cardiomyopathy ([place])    CHF (congestive heart failure) ([place])    Chicken pox    Coronary artery calcification    Hyperlipidemia    Hypertension    Vitiligo     PAST SURGICAL HISTORY: Past Surgical History:  Procedure Laterality Date   COLONOSCOPY  2014   in [place]-polyp per [place]   KNEE ARTHROSCOPY WITH LATERAL MENISECTOMY Left 09/22/2021   Procedure: KNEE ARTHROSCOPY WITH MEDIAL AND LATERAL MENISECTOMY AND DEBRIDEMENT;  Surgeon: Susa Day, MD;  Location: [facility];  Service: Orthopedics;  Laterality: Left;   NO PAST SURGERIES     POLYPECTOMY      FAMILY HISTORY: The patient family history includes Breast cancer in his maternal grandmother; Diabetes in his mother and paternal grandfather; Heart disease in his father, maternal grandfather, maternal grandmother, and mother; Hypertension in his father, maternal grandfather, maternal grandmother, and mother; Kidney disease in his mother; Stroke in his father and maternal grandfather.  SOCIAL HISTORY:  The patient  reports that he has never smoked. He has never used smokeless tobacco. He reports that he does not drink alcohol and does not use drugs.  REVIEW OF SYSTEMS: Review of Systems  Cardiovascular:  Positive for dyspnea on exertion (Improving) and leg swelling (Improving). Negative for chest pain, palpitations and syncope.  Respiratory:  Negative  for cough and shortness of breath.     PHYSICAL EXAM:    10/29/2021    1:34 PM 10/29/2021    1:33 PM 09/29/2021   11:06 AM  Vitals with BMI  Height  6\' 2"  6\' 2"   Weight  313 lbs 3 oz 310 lbs 10 oz  BMI  40.2 39.86  Systolic 129 140  Diastolic 85 93 86  Pulse 93 89 58    CONSTITUTIONAL: Well-developed  and well-nourished. No acute distress.  SKIN: Skin is warm and dry. No rash noted. No cyanosis. No pallor. No jaundice.  vitiligo. HEAD: Normocephalic and atraumatic.  EYES: No scleral icterus MOUTH/THROAT: Moist oral membranes.  NECK: No JVD present. No thyromegaly noted. No carotid bruits  CHEST Normal respiratory effort. No intercostal retractions  LUNGS: Clear to auscultation bilaterally.  No stridor. No wheezes. No rales.  CARDIOVASCULAR: Regular rate and rhythm, positive S1-S2, no murmurs rubs or gallops appreciated. ABDOMINAL: Soft, nontender, nondistended, positive bowel sounds in all 4 quadrants, no apparent ascites.  EXTREMITIES: +1 bilateral pitting edema up to mid shin.  warm to touch, 2+ bilateral DP and PT pulses HEMATOLOGIC: No significant bruising NEUROLOGIC: Oriented to person, place, and time. Nonfocal. Normal muscle tone.  PSYCHIATRIC: Normal mood and affect. Normal behavior. Cooperative  CARDIAC DATABASE: EKG: 03/31/2021: NSR, 77 bpm, poor R wave progression, LVH with voltage criteria. 07/06/2021: Normal sinus rhythm, 83 bpm, left axis, poor R wave progression, without underlying injury pattern.   Echocardiogram: 04/13/2021:  Left ventricle cavity is mildly dilated. Normal left ventricular wall thickness. Moderate global hypokinesis. LVEF 30-35%. Doppler evidence of grade I (impaired) diastolic dysfunction, normal LAP.  Left atrial cavity is mildly dilated. Aneurysmal interatrial septum  without 2D or color Doppler evidence of shunting.  The aortic root is normal. Mildly dilated ascending aorta at 3.9 cm. Estimated RAP 8 mmHg.   Stress Testing: Exercise Sestamibi stress test 06/21/2021: Exercise nuclear stress test was performed using Bruce protocol. Patient reached 7 METS, and 87% of age predicted maximum heart rate. Exercise capacity was low. No chest pain reported. Normal heart rate response. Hypertensive response with peak BP 210/108 mmHg. Stress EKG revealed no ischemic  changes. Dilated LV cavity with severe global decrease in wall motion and myocardial thickening. Stress LVEF 20%. Decreased tracer uptake in inferior myocardium at both rest and stress, likely due to diaphragmatic attenuation. 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  High risk study due to reduced LVEF.   Heart Catheterization: None  CCTA 08/03/2021: 1. Total coronary calcium score of 61.6. This was 71st percentile for age and sex matched control. 2. Normal coronary origin with left dominance. 3. CAD-RADS = 1. Left Main: Patent. LAD: Minimal plaque within proximal LAD otherwise patent. LCx: Patent RCA: Patent. 4. Stable small 3 mm left lower lobe pulmonary nodule which requires no follow-up. 5. No acute noncardiac cardiopulmonary finding.  LABORATORY DATA:  External Labs: Collected: February 18, 2021. Total cholesterol 233, triglycerides 99, HDL 45, LDL 171, non-HDL 189  Collected: March 17, 2021 provided by referring physician. Total cholesterol 174, triglycerides 72, HDL 41, LDL 120, non-HDL 134 Hemoglobin 14.3 g/dL, hematocrit February 20, 2021 BUN 15, creatinine 1.04. Sodium 140, potassium 3.7, chloride 100, bicarb 34. AST 15, ALT 19, alkaline phosphatase 113     Latest Ref Rng & Units 10/06/2021   11:55 AM 09/09/2021    2:54 PM 08/19/2021   11:58 AM  CMP  Glucose 70 - 99 mg/dL 94  97  09/11/2021   BUN 8 -  27 mg/dL 15  13  14    Creatinine 0.76 - 1.27 mg/dL  0.25  4.27   Sodium 134 - 144 mmol/L 142  139  142   Potassium 3.5 - 5.2 mmol/L 4.6  3.8  4.3   Chloride 96 - 106 mmol/L 102  105  99   CO2 20 - 29 mmol/L 26  27  25    Calcium 8.6 - 10.2 mg/dL 9.8  8.8  9.1   Total Protein 6.0 - 8.5 g/dL   7.1   Total Bilirubin 0.0 - 1.2 mg/dL   0.5   Alkaline Phos 44 - 121 IU/L   129   AST 0 - 40 IU/L   16   ALT 0 - 44 IU/L   17     Lipid Panel  Lab Results  Component Value Date   CHOL 122 08/19/2021   HDL 46 08/19/2021   LDLCALC 65 08/19/2021   LDLDIRECT 71  08/19/2021   TRIG 49 08/19/2021   CHOLHDL 4 06/20/2019    No components found for: "NTPROBNP" Recent Labs    06/02/21 1203 07/05/21 1311 07/27/21 1541 10/06/21 1155  PROBNP 72 <36 54 44   No results for input(s): "TSH" in the last 8760 hours.  BMP Recent Labs    08/19/21 1158 09/09/21 1454 10/06/21 1155  NA 142 139 142  K 4.3 3.8 4.6  CL 99 105 102  CO2 25 27 26   GLUCOSE 102* 97 94  BUN 14 13 15   CREATININE 1.01 0.95 1.06  CALCIUM 9.1 8.8* 9.8  GFRNONAA  --  >60  --     HEMOGLOBIN A1C Lab Results  Component Value Date   HGBA1C 6.4 06/20/2019    Cardiac Panel (last 3 results) No results for input(s): "CKTOTAL", "CKMB", "TROPONINIHS", "RELINDX" in the last 72 hours.  CHOLESTEROL Recent Labs    04/26/21 1122 08/19/21 1158  CHOL 142 122    Hepatic Function Panel Recent Labs    04/26/21 1121 08/19/21 1158  PROT 7.2 7.1  ALBUMIN 4.2 4.3  AST 14 16  ALT 19 17  ALKPHOS 126* 129*  BILITOT 0.5 0.5      IMPRESSION:    ICD-10-CM   1. Nonischemic cardiomyopathy (HCC)  I42.8 metoprolol succinate (TOPROL-XL) 50 MG 24 hr tablet    spironolactone (ALDACTONE) 25 MG tablet    PCV ECHOCARDIOGRAM COMPLETE    2. Chronic combined systolic and diastolic heart failure (HCC)  04/28/21 dapagliflozin propanediol (FARXIGA) 10 MG TABS tablet    spironolactone (ALDACTONE) 25 MG tablet    3. Coronary atherosclerosis due to calcified coronary lesion  I25.10    I25.84     4. Pure hypercholesterolemia  E78.00     5. Benign hypertension  I10     6. Class 3 severe obesity due to excess calories without serious comorbidity with body mass index (BMI) of 40.0 to 44.9 in adult [facility])  E66.01    Z68.41         RECOMMENDATIONS: Jayin Derousse is a 66 y.o. African-American male whose past medical history and cardiac risk factors include: Nonischemic cardiomyopathy, mild CAC, hypercholesterolemia, hypertension, family history of CAD/CVA, vitiligo, obesity due to excess  calories.    Nonischemic cardiomyopathy (HCC) Likely secondary to hypertensive heart disease. Medications reconciled -has been out of his B76.28 and spironolactone for the last 1 to 2 days. Medications refilled. Remote patient monitoring data reviewed. Clinically euvolemic. We will hold off on up titration of GDMT and following up with  ambulatory monitoring. We will uptitrate GDMT if needed based on renal function and hemodynamics. We will repeat an echocardiogram in 90 days to reevaluate LVEF  Chronic combined systolic and diastolic heart failure (HCC) Stage B, NYHA class II. No hospitalizations for heart failure. We will continue to uptitrate GDMT as discussed above Management as discussed above.  Coronary atherosclerosis due to calcified coronary lesion Mild coronary artery calcification as of July 2023.  Continue aspirin and statin therapy. Minimal nonobstructive CAD. No additional work-up warranted at this time as he is currently asymptomatic.  Pure hypercholesterolemia Index LDL 171 mg/dL. Continue statin therapy. Most recent lipid profile from August 2023 independently reviewed, LDL 71 mg/dL. Does not endorse myalgias.  Benign hypertension Office and home blood pressures are improving. Medications refilled He has made a significant improvement with regards to reducing salt in his diet. We will monitoring his blood pressures via RPM.  FINAL MEDICATION LIST END OF ENCOUNTER: Meds ordered this encounter  Medications   dapagliflozin propanediol (FARXIGA) 10 MG TABS tablet    Sig: TAKE 1 TABLET(10 MG) BY MOUTH DAILY BEFORE BREAKFAST    Dispense:  90 tablet    Refill:  1   metoprolol succinate (TOPROL-XL) 50 MG 24 hr tablet    Sig: TAKE 1 TABLET(50 MG) BY MOUTH EVERY MORNING    Dispense:  90 tablet    Refill:  1   spironolactone (ALDACTONE) 25 MG tablet    Sig: Take 1 tablet (25 mg total) by mouth every morning.    Dispense:  90 tablet    Refill:  1    Medications  Discontinued During This Encounter  Medication Reason   metoprolol succinate (TOPROL-XL) 50 MG 24 hr tablet Reorder   spironolactone (ALDACTONE) 25 MG tablet Reorder   FARXIGA 10 MG TABS tablet Reorder    Current Outpatient Medications:    aspirin EC 81 MG tablet, Take 1 tablet (81 mg total) by mouth daily. Swallow whole., Disp: 30 tablet, Rfl: 12   atorvastatin (LIPITOR) 40 MG tablet, Take 1 tablet (40 mg total) by mouth at bedtime., Disp: 90 tablet, Rfl: 0   sacubitril-valsartan (ENTRESTO) 97-103 MG, Take 1 tablet by mouth 2 (two) times daily., Disp: 180 tablet, Rfl: 0   dapagliflozin propanediol (FARXIGA) 10 MG TABS tablet, TAKE 1 TABLET(10 MG) BY MOUTH DAILY BEFORE BREAKFAST, Disp: 90 tablet, Rfl: 1   metoprolol succinate (TOPROL-XL) 50 MG 24 hr tablet, TAKE 1 TABLET(50 MG) BY MOUTH EVERY MORNING, Disp: 90 tablet, Rfl: 1   spironolactone (ALDACTONE) 25 MG tablet, Take 1 tablet (25 mg total) by mouth every morning., Disp: 90 tablet, Rfl: 1  Orders Placed This Encounter  Procedures   PCV ECHOCARDIOGRAM COMPLETE    There are no Patient Instructions on file for this visit.   --Continue cardiac medications as reconciled in final medication list. --Return in about 3 months (around 02/10/2022) for Follow up, heart failure management.. Or sooner if needed. --Continue follow-up with your primary care physician regarding the management of your other chronic comorbid conditions.  Patient's questions and concerns were addressed to his satisfaction. He voices understanding of the instructions provided during this encounter.   This note was created using a voice recognition software as a result there may be grammatical errors inadvertently enclosed that do not reflect the nature of this encounter. Every attempt is made to correct such errors.  Tessa Lerner, [place], [facility]  Pager: [phone] Office: [phone]

## 2021-11-15 DIAGNOSIS — I10 Essential (primary) hypertension: Secondary | ICD-10-CM | POA: Diagnosis not present

## 2021-11-27 DIAGNOSIS — G4733 Obstructive sleep apnea (adult) (pediatric): Secondary | ICD-10-CM | POA: Diagnosis not present

## 2021-12-15 DIAGNOSIS — I10 Essential (primary) hypertension: Secondary | ICD-10-CM | POA: Diagnosis not present

## 2021-12-27 DIAGNOSIS — G4733 Obstructive sleep apnea (adult) (pediatric): Secondary | ICD-10-CM | POA: Diagnosis not present

## 2022-01-15 DIAGNOSIS — I10 Essential (primary) hypertension: Secondary | ICD-10-CM | POA: Diagnosis not present

## 2022-01-17 ENCOUNTER — Ambulatory Visit: Payer: Medicare Other

## 2022-01-17 DIAGNOSIS — I428 Other cardiomyopathies: Secondary | ICD-10-CM | POA: Diagnosis not present

## 2022-01-19 DIAGNOSIS — G4733 Obstructive sleep apnea (adult) (pediatric): Secondary | ICD-10-CM | POA: Diagnosis not present

## 2022-01-24 ENCOUNTER — Telehealth: Payer: Self-pay

## 2022-01-24 DIAGNOSIS — I5042 Chronic combined systolic (congestive) and diastolic (congestive) heart failure: Secondary | ICD-10-CM

## 2022-01-24 NOTE — Telephone Encounter (Signed)
Reviewed results with patient and instructed him to have labs drawn 48 hours prior to his appointment. Patient verbalized understanding.

## 2022-01-24 NOTE — Telephone Encounter (Signed)
-----  Message from [place], [place] sent at 01/23/2022 11:19 AM EST ----- No significant change in LVEF compared to his prior study. Please order and release NT proBNP, BMP, and magnesium 48 hours prior to his next office visit.  Sunit [place], DO, [facility]

## 2022-01-27 DIAGNOSIS — G4733 Obstructive sleep apnea (adult) (pediatric): Secondary | ICD-10-CM | POA: Diagnosis not present

## 2022-02-15 DIAGNOSIS — I10 Essential (primary) hypertension: Secondary | ICD-10-CM | POA: Diagnosis not present

## 2022-02-23 ENCOUNTER — Other Ambulatory Visit: Payer: Self-pay | Admitting: Cardiology

## 2022-02-25 LAB — BASIC METABOLIC PANEL
BUN/Creatinine Ratio: 14 (ref 10–24)
BUN: 17 mg/dL (ref 8–27)
CO2: 27 mmol/L (ref 20–29)
Calcium: 10 mg/dL (ref 8.6–10.2)
Chloride: 101 mmol/L (ref 96–106)
Creatinine, Ser: 1.18 mg/dL (ref 0.76–1.27)
Glucose: 91 mg/dL (ref 70–99)
Potassium: 4.4 mmol/L (ref 3.5–5.2)
Sodium: 144 mmol/L (ref 134–144)
eGFR: 68 mL/min/{1.73_m2} (ref >59)

## 2022-02-25 LAB — MAGNESIUM: Magnesium: 1.9 mg/dL (ref 1.6–2.3)

## 2022-02-25 LAB — PRO B NATRIURETIC PEPTIDE: NT-Pro BNP: <36 pg/mL (ref 0–376)

## 2022-02-28 ENCOUNTER — Ambulatory Visit: Payer: Medicare Other | Admitting: Cardiology

## 2022-03-10 ENCOUNTER — Telehealth: Payer: Self-pay

## 2022-03-10 ENCOUNTER — Encounter: Payer: Self-pay | Admitting: Cardiology

## 2022-03-10 ENCOUNTER — Ambulatory Visit: Payer: Medicare Other | Admitting: Cardiology

## 2022-03-10 VITALS — BP 146/80 | HR 74 | Resp 18 | Ht 75.0 in | Wt 314.6 lb

## 2022-03-10 DIAGNOSIS — E7800 Pure hypercholesterolemia, unspecified: Secondary | ICD-10-CM

## 2022-03-10 DIAGNOSIS — I2510 Atherosclerotic heart disease of native coronary artery without angina pectoris: Secondary | ICD-10-CM

## 2022-03-10 DIAGNOSIS — E66813 Obesity, class 3: Secondary | ICD-10-CM

## 2022-03-10 DIAGNOSIS — I10 Essential (primary) hypertension: Secondary | ICD-10-CM

## 2022-03-10 DIAGNOSIS — I5042 Chronic combined systolic (congestive) and diastolic (congestive) heart failure: Secondary | ICD-10-CM

## 2022-03-10 DIAGNOSIS — I428 Other cardiomyopathies: Secondary | ICD-10-CM

## 2022-03-10 NOTE — Telephone Encounter (Signed)
Patient home blood pressure is well controlled on current antihypertensive regimen.  Systolic Blood Pressure mmHg -- 115.4 ([id number] - [id number]) Diastolic Blood Pressure mmHg -- 81.5 (74.0 - 93.0) Heart Rate bpm -- 61.5 (53.0 - 67.0)  03/10/22 7:07 AM     108 / 75 mmHg 67 bpm  03/09/22 7:22 AM     108 / 75 mmHg 66 bpm  03/08/22 7:37 AM     120 / 85 mmHg 57 bpm  03/07/22 8:08 AM     125 / 88 mmHg 65 bpm  03/06/22 10:23 AM     109 / 80 mmHg 62 bpm  03/05/22 10:20 AM       116 / 81 mmHg 64 bpm  03/04/22 7:34 AM     117 / 80 mmHg 60 bpm  03/03/22 7:16 AM     108 / 77 mmHg 55 bpm

## 2022-03-10 NOTE — Progress Notes (Signed)
ID:  Sean Norman, DOB [birth year], MRN [id number]  PCP:  Leeroy Cha, MD  Cardiologist:  Rex Kras, DO, [facility] (established care March 31, 2021)  Date: 03/10/22 Last Office Visit: 10/29/2021  Chief Complaint  Patient presents with   Congestive Heart Failure   Follow-up    3 months    HPI  Sean Norman is a 67 y.o. African-American male whose past medical history and cardiovascular risk factors include: Nonischemic cardiomyopathy, mild CAC, hypercholesterolemia, hypertension, family history of CAD/CVA, vitiligo, obesity due to excess calories.   Patient was referred to the practice for hypertensive heart disease.  His echocardiogram noted reduced LVEF, mildly dilated left ventricle, no significant valvular heart disease.  He underwent ischemic workup including a coronary CTA which noted mild CAC and nonobstructive CAD.  Patient's medications have been uptitrated as per GDMT and now presents for follow-up.  After being on maximally tolerated doses of GDMT patient had an echocardiogram which reveals no significant improvement in LVEF.  Clinically denies anginal discomfort or heart failure symptoms.  No hospitalizations for congestive heart failure since last office encounter.  He is currently in [employer] after his orthopedic surgery.  FUNCTIONAL STATUS: Tries to walk 12 acres of his property twice a week.  ALLERGIES: No Known Allergies  MEDICATION LIST PRIOR TO VISIT: Current Meds  Medication Sig   aspirin EC 81 MG tablet Take 1 tablet (81 mg total) by mouth daily. Swallow whole.   atorvastatin (LIPITOR) 40 MG tablet Take 1 tablet (40 mg total) by mouth at bedtime.   dapagliflozin propanediol (FARXIGA) 10 MG TABS tablet TAKE 1 TABLET(10 MG) BY MOUTH DAILY BEFORE BREAKFAST   metoprolol succinate (TOPROL-XL) 50 MG 24 hr tablet TAKE 1 TABLET(50 MG) BY MOUTH EVERY MORNING   sacubitril-valsartan (ENTRESTO) 97-103 MG Take 1 tablet by mouth 2 (two) times daily.    spironolactone (ALDACTONE) 25 MG tablet Take 1 tablet (25 mg total) by mouth every morning.     PAST MEDICAL HISTORY: Past Medical History:  Diagnosis Date   Allergic rhinitis 05/02/2019   Cardiomyopathy ([place])    CHF (congestive heart failure) ([place])    Chicken pox    Coronary artery calcification    Hyperlipidemia    Hypertension    Vitiligo     PAST SURGICAL HISTORY: Past Surgical History:  Procedure Laterality Date   COLONOSCOPY  2014   in [place]-polyp per pt   KNEE ARTHROSCOPY WITH LATERAL MENISECTOMY Left 09/22/2021   Procedure: KNEE ARTHROSCOPY WITH MEDIAL AND LATERAL MENISECTOMY AND DEBRIDEMENT;  Surgeon: Susa Day, MD;  Location: [facility];  Service: Orthopedics;  Laterality: Left;   NO PAST SURGERIES     POLYPECTOMY      FAMILY HISTORY: The patient family history includes Breast cancer in his maternal grandmother; Diabetes in his mother and paternal grandfather; Heart disease in his father, maternal grandfather, maternal grandmother, and mother; Hypertension in his father, maternal grandfather, maternal grandmother, and mother; Kidney disease in his mother; Stroke in his father and maternal grandfather.  SOCIAL HISTORY:  The patient  reports that he has never smoked. He has never used smokeless tobacco. He reports that he does not drink alcohol and does not use drugs.  REVIEW OF SYSTEMS: Review of Systems  Cardiovascular:  Positive for dyspnea on exertion (Improving) and leg swelling (Improving). Negative for chest pain, palpitations and syncope.  Respiratory:  Negative for cough and shortness of breath.     PHYSICAL EXAM:    03/10/2022    3:23 PM  10/29/2021    1:34 PM 10/29/2021    1:33 PM  Vitals with BMI  Height '6\' 3"'$   '6\' 2"'$   Weight 314 lbs 10 oz  313 lbs 3 oz  BMI [id number]  [id number]  Systolic [id number]  Diastolic 80 85 93  Pulse 74 93 89    CONSTITUTIONAL: Well-developed and well-nourished. No acute distress.  SKIN: Skin is warm and dry. No rash  noted. No cyanosis. No pallor. No jaundice.  vitiligo. HEAD: Normocephalic and atraumatic.  EYES: No scleral icterus MOUTH/THROAT: Moist oral membranes.  NECK: No JVD present. No thyromegaly noted. No carotid bruits  CHEST Normal respiratory effort. No intercostal retractions  LUNGS: Clear to auscultation bilaterally.  No stridor. No wheezes. No rales.  CARDIOVASCULAR: Regular rate and rhythm, positive S1-S2, no murmurs rubs or gallops appreciated. ABDOMINAL: Soft, nontender, nondistended, positive bowel sounds in all 4 quadrants, no apparent ascites.  EXTREMITIES: no pitting edema,  warm to touch, 2+ bilateral DP and PT pulses HEMATOLOGIC: No significant bruising NEUROLOGIC: Oriented to person, place, and time. Nonfocal. Normal muscle tone.  PSYCHIATRIC: Normal mood and affect. Normal behavior. Cooperative  CARDIAC DATABASE: EKG: 03/31/2021: NSR, 77 bpm, poor R wave progression, LVH with voltage criteria. 07/06/2021: Normal sinus rhythm, 83 bpm, left axis, poor R wave progression, without underlying injury pattern.  03/10/2022: Sinus rhythm, 65 bpm, LVH per voltage criteria poor R wave progression.  Echocardiogram: 04/13/2021:  Left ventricle cavity is mildly dilated. Normal left ventricular wall thickness. Moderate global hypokinesis. LVEF 30-35%. Doppler evidence of grade I (impaired) diastolic dysfunction, normal LAP.  Left atrial cavity is mildly dilated. Aneurysmal interatrial septum  without 2D or color Doppler evidence of shunting.  The aortic root is normal. Mildly dilated ascending aorta at 3.9 cm. Estimated RAP 8 mmHg.  01/17/2022: Left ventricle cavity is moderately dilated. Normal left ventricular wall thickness. Moderate global hypokinesis. LVEF 30-35%. Doppler evidence of grade I (impaired) diastolic dysfunction, normal LAP.  Left atrial cavity is mildly dilated. No significant valvular abnormality. No evidence of pulmonary hypertension. Previous study on 04/13/2021 reported  ascending aorta 3.9 cm, not appreciated on this study.    Stress Testing: Exercise Sestamibi stress test 06/21/2021: Exercise nuclear stress test was performed using Bruce protocol. Patient reached 7 METS, and 87% of age predicted maximum heart rate. Exercise capacity was low. No chest pain reported. Normal heart rate response. Hypertensive response with peak BP 210/108 mmHg. Stress EKG revealed no ischemic changes. Dilated LV cavity with severe global decrease in wall motion and myocardial thickening. Stress LVEF 20%. Decreased tracer uptake in inferior myocardium at both rest and stress, likely due to diaphragmatic attenuation. 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  High risk study due to reduced LVEF.   Heart Catheterization: None  CCTA 08/03/2021: 1. Total coronary calcium score of 61.6. This was 71st percentile for age and sex matched control. 2. Normal coronary origin with left dominance. 3. CAD-RADS = 1. Left Main: Patent. LAD: Minimal plaque within proximal LAD otherwise patent. LCx: Patent RCA: Patent. 4. Stable small 3 mm left lower lobe pulmonary nodule which requires no follow-up. 5. No acute noncardiac cardiopulmonary finding.  AMBULATORY BLOOD PRESSURE MONITORING Patient home blood pressure is well controlled on current antihypertensive regimen. Systolic Blood Pressure      mmHg  -- 115.4 ([id number] - [id number]) Diastolic Blood Pressure     mmHg  -- 81.5 (74.0 - 93.0) Heart Rate      bpm     -- 61.5 (53.0 -  67.0)   03/10/22 7:07 AM                     108      /           75        mmHg  67        bpm      03/09/22 7:22 AM                     108      /           75        mmHg  66        bpm      03/08/22 7:37 AM                     120      /           85        mmHg  57        bpm      03/07/22 8:08 AM                     125      /           88        mmHg  65        bpm      03/06/22 10:23 AM                   109      /           80        mmHg  62         bpm      03/05/22 10:20 AM                   116      /           81        mmHg  64        bpm      03/04/22 7:34 AM                     117      /           80        mmHg  60        bpm      03/03/22 7:16 AM                     108      /           77        mmHg  55        bpm  LABORATORY DATA: External Labs: Collected: February 18, 2021. Total cholesterol 233, triglycerides 99, HDL 45, LDL 171, non-HDL 189  Collected: March 17, 2021 provided by referring physician. Total cholesterol 174, triglycerides 72, HDL 41, LDL 120, non-HDL 134 Hemoglobin 14.3 g/dL, hematocrit 42.8% BUN 15, creatinine 1.04. Sodium 140, potassium 3.7, chloride 100, bicarb 34. AST 15, ALT 19, alkaline phosphatase 113     Latest Ref Rng & Units 02/23/2022    1:09 PM 10/06/2021   11:55 AM 09/09/2021    2:54 PM  CMP  Glucose 70 - 99 mg/dL 91  94  97   BUN 8 - 27 mg/dL '17  15  13   '$ Creatinine 0.76 - 1.27 mg/dL 1.18  1.06  0.95   Sodium 134 - 144 mmol/L 144  142  139   Potassium 3.5 - 5.2 mmol/L 4.4  4.6  3.8   Chloride 96 - 106 mmol/L 101  102  105   CO2 20 - 29 mmol/L '27  26  27   '$ Calcium 8.6 - 10.2 mg/dL 10.0  9.8  8.8     Lipid Panel  Lab Results  Component Value Date   CHOL 122 08/19/2021   HDL 46 08/19/2021   LDLCALC 65 08/19/2021   LDLDIRECT 71 08/19/2021   TRIG 49 08/19/2021   CHOLHDL 4 06/20/2019    No components found for: "NTPROBNP" Recent Labs    06/02/21 1203 07/05/21 1311 07/27/21 1541 10/06/21 1155 02/23/22 1309  PROBNP 72 <36 54 44 <36   No results for input(s): "TSH" in the last 8760 hours.  BMP Recent Labs    09/09/21 1454 10/06/21 1155 02/23/22 1309  NA 139 142 144  K 3.8 4.6 4.4  CL 105 102 101  CO2 '27 26 27  '$ GLUCOSE 97 94 91  BUN '13 15 17  '$ CREATININE 0.95 1.06 1.18  CALCIUM 8.8* 9.8 10.0  GFRNONAA >60  --   --     HEMOGLOBIN A1C Lab Results  Component Value Date   HGBA1C 6.4 06/20/2019    Cardiac Panel (last 3 results) No results for input(s):  "CKTOTAL", "CKMB", "TROPONINIHS", "RELINDX" in the last 72 hours.  CHOLESTEROL Recent Labs    04/26/21 1122 08/19/21 1158  CHOL 142 122    Hepatic Function Panel Recent Labs    04/26/21 1121 08/19/21 1158  PROT 7.2 7.1  ALBUMIN 4.2 4.3  AST 14 16  ALT 19 17  ALKPHOS 126* 129*  BILITOT 0.5 0.5      IMPRESSION:    ICD-10-CM   1. Nonischemic cardiomyopathy ([place])  I42.8 EKG 12-Lead    Ambulatory referral to Cardiac Electrophysiology    2. Chronic combined systolic and diastolic heart failure (HCC)  I50.42 Ambulatory referral to Cardiac Electrophysiology    3. Coronary atherosclerosis due to calcified coronary lesion  I25.10    I25.84     4. Pure hypercholesterolemia  E78.00     5. Benign hypertension  I10     6. Class 3 severe obesity due to excess calories without serious comorbidity with body mass index (BMI) of 40.0 to 44.9 in adult [facility])  E66.01    Z68.41         RECOMMENDATIONS: Sean Norman is a 67 y.o. African-American male whose past medical history and cardiac risk factors include: Nonischemic cardiomyopathy, mild CAC, hypercholesterolemia, hypertension, family history of CAD/CVA, vitiligo, obesity due to excess calories.    Nonischemic cardiomyopathy (HCC) Chronic combined systolic and diastolic heart failure (HCC) Stage B, NYHA class II. No hospitalizations for heart failure. Likely secondary to hypertensive heart disease. Medications reconciled  No significant improvement in LVEF. Will refer to EP to evaluate for candidacy for ICD implant for primary prevention. Most recent echocardiogram results reviewed with him in great detail and noted above for further reference. Ambulatory monitoring data reviewed as part of medical decision making.  Coronary atherosclerosis due to calcified coronary lesion Mild coronary artery calcification as of July 2023.  Continue aspirin and statin therapy. Minimal nonobstructive CAD. No additional work-up warranted at  this time as he is currently asymptomatic.  Pure hypercholesterolemia Index LDL 171 mg/dL. Continue statin therapy. Most recent lipid profile from August 2023 independently reviewed, LDL 71 mg/dL. Does not endorse myalgias.  Benign hypertension Home BP currently at goal.  Medications refilled He has made a significant improvement with regards to reducing salt in his diet. We will monitoring his blood pressures via RPM.  FINAL MEDICATION LIST END OF ENCOUNTER: No orders of the defined types were placed in this encounter.   There are no discontinued medications.   Current Outpatient Medications:    aspirin EC 81 MG tablet, Take 1 tablet (81 mg total) by mouth daily. Swallow whole., Disp: 30 tablet, Rfl: 12   atorvastatin (LIPITOR) 40 MG tablet, Take 1 tablet (40 mg total) by mouth at bedtime., Disp: 90 tablet, Rfl: 0   dapagliflozin propanediol (FARXIGA) 10 MG TABS tablet, TAKE 1 TABLET(10 MG) BY MOUTH DAILY BEFORE BREAKFAST, Disp: 90 tablet, Rfl: 1   metoprolol succinate (TOPROL-XL) 50 MG 24 hr tablet, TAKE 1 TABLET(50 MG) BY MOUTH EVERY MORNING, Disp: 90 tablet, Rfl: 1   sacubitril-valsartan (ENTRESTO) 97-103 MG, Take 1 tablet by mouth 2 (two) times daily., Disp: , Rfl:    spironolactone (ALDACTONE) 25 MG tablet, Take 1 tablet (25 mg total) by mouth every morning., Disp: 90 tablet, Rfl: 1  Orders Placed This Encounter  Procedures   Ambulatory referral to Cardiac Electrophysiology   EKG 12-Lead    There are no Patient Instructions on file for this visit.   --Continue cardiac medications as reconciled in final medication list. --Return in about 3 months (around 06/08/2022) for Follow up [place]. Or sooner if needed. --Continue follow-up with your primary care physician regarding the management of your other chronic comorbid conditions.  Patient's questions and concerns were addressed to his satisfaction. He voices understanding of the instructions provided during this encounter.    This note was created using a voice recognition software as a result there may be grammatical errors inadvertently enclosed that do not reflect the nature of this encounter. Every attempt is made to correct such errors.  Rex Kras, [place], [facility]  Pager: [phone] Office: [phone]

## 2022-04-12 ENCOUNTER — Other Ambulatory Visit: Payer: Self-pay | Admitting: Internal Medicine

## 2022-04-12 DIAGNOSIS — I5042 Chronic combined systolic (congestive) and diastolic (congestive) heart failure: Secondary | ICD-10-CM

## 2022-04-12 DIAGNOSIS — I428 Other cardiomyopathies: Secondary | ICD-10-CM

## 2022-04-13 NOTE — Telephone Encounter (Signed)
Can we fill?

## 2022-04-13 NOTE — Telephone Encounter (Signed)
yes

## 2022-04-16 DIAGNOSIS — I10 Essential (primary) hypertension: Secondary | ICD-10-CM | POA: Diagnosis not present

## 2022-04-20 ENCOUNTER — Ambulatory Visit: Payer: Medicare Other | Attending: Internal Medicine | Admitting: Internal Medicine

## 2022-04-20 ENCOUNTER — Encounter: Payer: Self-pay | Admitting: Internal Medicine

## 2022-04-20 VITALS — BP 122/84 | HR 63 | Ht 76.0 in | Wt 318.0 lb

## 2022-04-20 DIAGNOSIS — I10 Essential (primary) hypertension: Secondary | ICD-10-CM | POA: Diagnosis not present

## 2022-04-20 DIAGNOSIS — I5020 Unspecified systolic (congestive) heart failure: Secondary | ICD-10-CM

## 2022-04-20 DIAGNOSIS — I428 Other cardiomyopathies: Secondary | ICD-10-CM | POA: Diagnosis not present

## 2022-04-20 DIAGNOSIS — I509 Heart failure, unspecified: Secondary | ICD-10-CM | POA: Insufficient documentation

## 2022-04-20 NOTE — Patient Instructions (Addendum)
Medication Instructions:  Your physician recommends that you continue on your current medications as directed. Please refer to the Current Medication list given to you today.  *If you need a refill on your cardiac medications before your next appointment, please call your pharmacy*  Lab Work: None ordered.  You will have a CBC and BMET drawn within 30 days of your procedure.    If you have labs (blood work) drawn today and your tests are completely normal, you will receive your results only by: MyChart Message (if you have MyChart) OR A paper copy in the mail If you have any lab test that is abnormal or we need to change your treatment, we will call you to review the results.  Testing/Procedures: None ordered.  Follow-Up: Dr. Lewayne Bunting has ordered a Medtronic ICD, and dates to be given to the patient during this visit.  Dates available in the procedure lab are as follows:  May:  13, 20. 21, and 31.    Please select a day, and call us at [phone] to schedule your ICD implant procedure with Dr. Ladona Ridgel.  You will receive an instruction letter and surgical scrub in the near future.    See ICD Instructional booklet.    The format for your next appointment:   In Person  Provider:   Lewayne Bunting, MD{or one of the following Advanced Practice Providers on your designated Care Team:   Francis Dowse, [place] Casimiro Needle "[facility]" [place], [place]

## 2022-04-20 NOTE — Progress Notes (Signed)
HPI Sean Norman is referred by Dr. Odis Hollingshead for evaluation of and consideration for ICD insertion. He is a pleasant 67 yo man with a h/o non-ischemic CM, chronic class 2A systolic heart failure, and vitiligo. He has been on GDMT for several months. His EF was initially 25 and is now 30-35%. He has not had syncope. He denies chest pain. Minimal edema. He has a remote h/o uncontrolled HTN but none recently. No Known Allergies   Current Outpatient Medications  Medication Sig Dispense Refill   amLODipine (NORVASC) 10 MG tablet Take 10 mg by mouth daily.     aspirin EC 81 MG tablet Take 1 tablet (81 mg total) by mouth daily. Swallow whole. 30 tablet 12   dapagliflozin propanediol (FARXIGA) 10 MG TABS tablet TAKE 1 TABLET(10 MG) BY MOUTH DAILY BEFORE BREAKFAST 90 tablet 1   hydrochlorothiazide (HYDRODIURIL) 25 MG tablet Take 25 mg by mouth every morning.     metoprolol succinate (TOPROL-XL) 50 MG 24 hr tablet TAKE 1 TABLET(50 MG) BY MOUTH EVERY MORNING 90 tablet 1   sacubitril-valsartan (ENTRESTO) 97-103 MG Take 1 tablet by mouth 2 (two) times daily. 60 tablet 2   spironolactone (ALDACTONE) 25 MG tablet Take 1 tablet (25 mg total) by mouth every morning. 90 tablet 1   atorvastatin (LIPITOR) 40 MG tablet Take 1 tablet (40 mg total) by mouth at bedtime. 90 tablet 0   No current facility-administered medications for this visit.     Past Medical History:  Diagnosis Date   Allergic rhinitis 05/02/2019   Cardiomyopathy    CHF (congestive heart failure)    Chicken pox    Coronary artery calcification    Hyperlipidemia    Hypertension    Vitiligo     ROS:   All systems reviewed and negative except as noted in the HPI.   Past Surgical History:  Procedure Laterality Date   COLONOSCOPY  2014   in [place]-polyp per pt   KNEE ARTHROSCOPY WITH LATERAL MENISECTOMY Left 09/22/2021   Procedure: KNEE ARTHROSCOPY WITH MEDIAL AND LATERAL MENISECTOMY AND DEBRIDEMENT;  Surgeon: Jene Every,  MD;  Location: [facility];  Service: Orthopedics;  Laterality: Left;   NO PAST SURGERIES     POLYPECTOMY       Family History  Problem Relation Age of Onset   Heart disease Mother    Hypertension Mother    Kidney disease Mother    Diabetes Mother    Heart disease Father    Stroke Father    Hypertension Father    Breast cancer Maternal Grandmother    Hypertension Maternal Grandmother    Heart disease Maternal Grandmother    Hypertension Maternal Grandfather    Stroke Maternal Grandfather    Heart disease Maternal Grandfather    Diabetes Paternal Grandfather    Colon cancer Neg Hx    Colon polyps Neg Hx    Esophageal cancer Neg Hx    Rectal cancer Neg Hx    Stomach cancer Neg Hx      Social History   Socioeconomic History   Marital status: Married    Spouse name: Not on file   Number of children: 5   Years of education: Not on file   Highest education level: Not on file  Occupational History   Not on file  Tobacco Use   Smoking status: Never   Smokeless tobacco: Never  Vaping Use   Vaping Use: Never used  Substance and Sexual Activity   Alcohol  use: No   Drug use: No   Sexual activity: Not on file  Other Topics Concern   Not on file  Social History Narrative   Not on file   Social Determinants of Health   Financial Resource Strain: Not on file  Food Insecurity: Not on file  Transportation Needs: Not on file  Physical Activity: Not on file  Stress: Not on file  Social Connections: Not on file  Intimate Partner Violence: Not on file     BP 122/84   Pulse 63   Ht 6\' 4"  (1.93 m)   Wt (!) 318 lb (144.2 kg)   SpO2 96%   BMI 38.71 kg/m   Physical Exam:  Well appearing 67 yo man,  NAD HEENT: Unremarkable Neck:  No JVD, no thyromegally Lymphatics:  No adenopathy Back:  No CVA tenderness Lungs:  Clear with no wheezes HEART:  Regular rate rhythm, no murmurs, no rubs, no clicks Abd:  soft, positive bowel sounds, no organomegally, no rebound, no  guarding Ext:  2 plus pulses, no edema, no cyanosis, no clubbing Skin:  No rashes no nodules Neuro:  CN II through XII intact, motor grossly intact  EKG - nsr with PVC's  Assess/Plan: Chronic systolic heart failure -I have discussed the treatment options with the patient. He is on GDMT and is doing well. I discussed the treatment options including ICD insertion. The risks/benefits/goals/expectations of the procedure were reviewed and he will call us if he wishes to proceed.  Sean Gowda Jajaira Ruis,MD

## 2022-05-11 ENCOUNTER — Other Ambulatory Visit: Payer: Self-pay | Admitting: Cardiology

## 2022-05-11 DIAGNOSIS — I5042 Chronic combined systolic (congestive) and diastolic (congestive) heart failure: Secondary | ICD-10-CM

## 2022-05-11 DIAGNOSIS — I428 Other cardiomyopathies: Secondary | ICD-10-CM

## 2022-05-16 DIAGNOSIS — I10 Essential (primary) hypertension: Secondary | ICD-10-CM | POA: Diagnosis not present

## 2022-06-09 ENCOUNTER — Ambulatory Visit: Payer: Medicare Other | Admitting: Cardiology

## 2022-06-15 DIAGNOSIS — I10 Essential (primary) hypertension: Secondary | ICD-10-CM | POA: Diagnosis not present

## 2022-06-18 ENCOUNTER — Other Ambulatory Visit: Payer: Self-pay | Admitting: Cardiology

## 2022-06-18 DIAGNOSIS — I5042 Chronic combined systolic (congestive) and diastolic (congestive) heart failure: Secondary | ICD-10-CM

## 2022-06-18 DIAGNOSIS — I428 Other cardiomyopathies: Secondary | ICD-10-CM

## 2022-09-28 DIAGNOSIS — I420 Dilated cardiomyopathy: Secondary | ICD-10-CM | POA: Diagnosis not present

## 2022-09-28 DIAGNOSIS — I110 Hypertensive heart disease with heart failure: Secondary | ICD-10-CM | POA: Diagnosis not present

## 2022-09-28 DIAGNOSIS — I5020 Unspecified systolic (congestive) heart failure: Secondary | ICD-10-CM | POA: Diagnosis not present

## 2022-10-31 IMAGING — CT CT CARDIAC CORONARY ARTERY CALCIUM SCORE
3 series · 14 of 20 positions shown, 16 images · non-contrast
Comparison: None.

CLINICAL DATA: 65-year-old black male

EXAM:
CT CARDIAC CORONARY ARTERY CALCIUM SCORE
TECHNIQUE: Non-contrast imaging through the heart was performed using
prospective ECG gating. Image post processing was performed on an
independent workstation, allowing for quantitative analysis of the
heart and coronary arteries. Note that this exam targets the heart
and the chest was not imaged in its entirety.

[Series 2: calcium scoring 2.00 qr36 bestdiast 71% hrt calciu · axial · 0.52mm/px · z∈[+1626,+1710]mm · 4 of 70 slices shown]
[im 14/70  vessel]
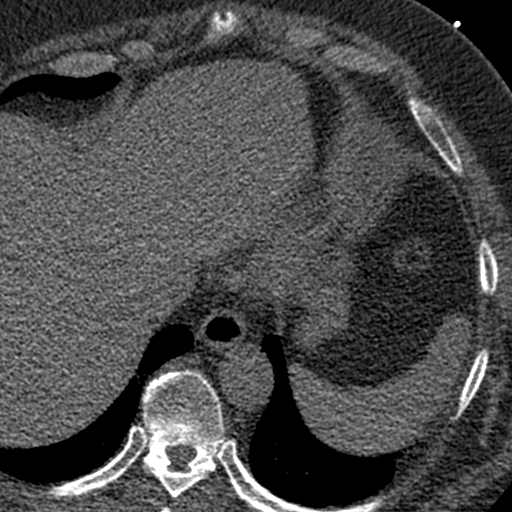
[im 28/70  vessel]
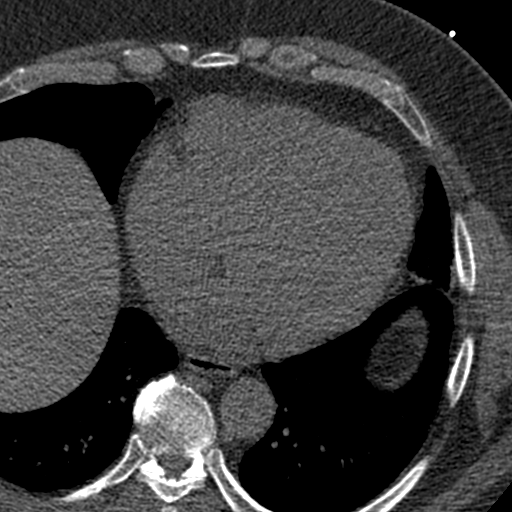
[im 42/70  vessel]
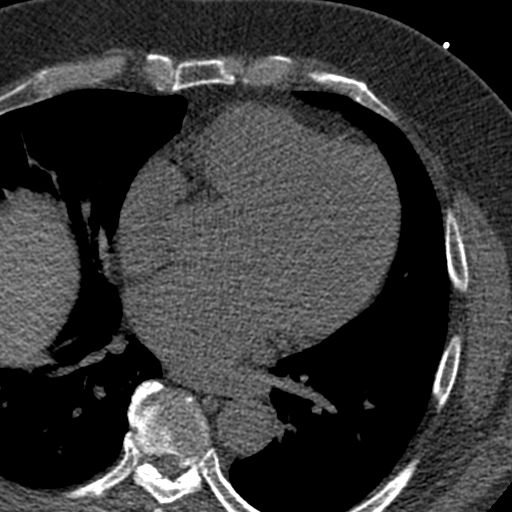
[im 56/70  vessel]
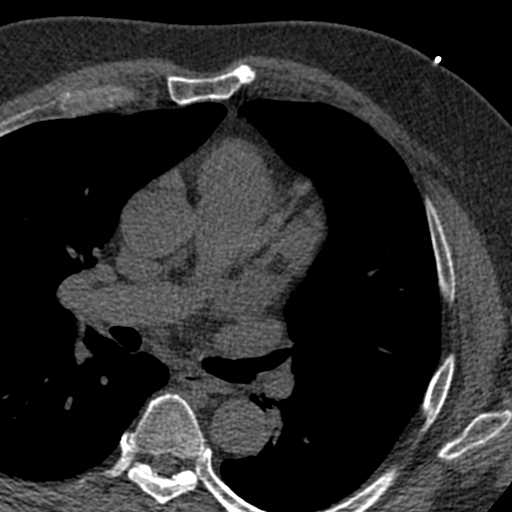

[Series 3: calcium scoring 2.00 br40 bestdiast 71% axial · axial · 0.69mm/px · z∈[+1622,+1714]mm · 5 of 70 slices shown, 7 images]
[im 12/70  vessel]
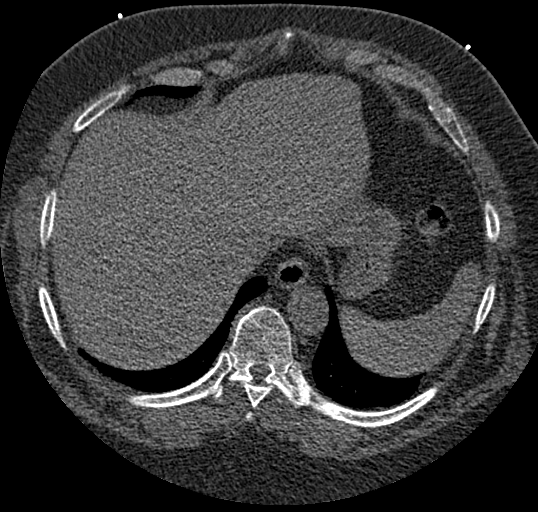
[im 12/70  lung]
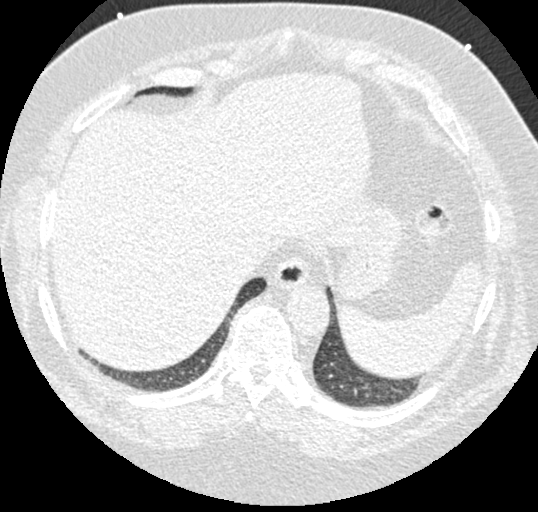
[im 24/70  vessel]
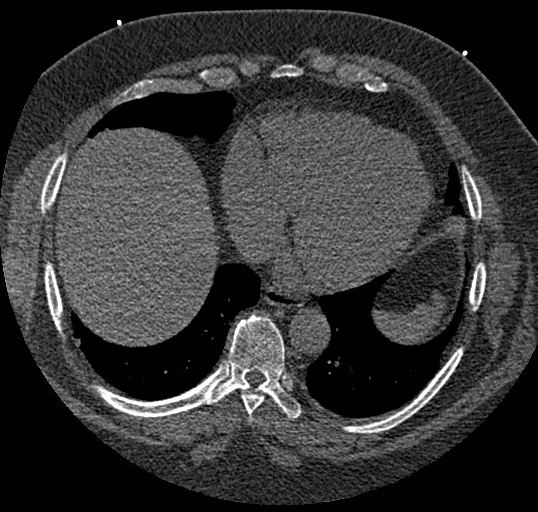
[im 35/70  vessel]
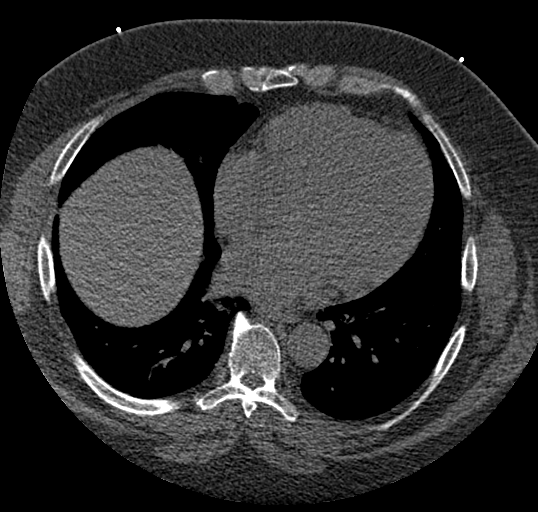
[im 47/70  vessel]
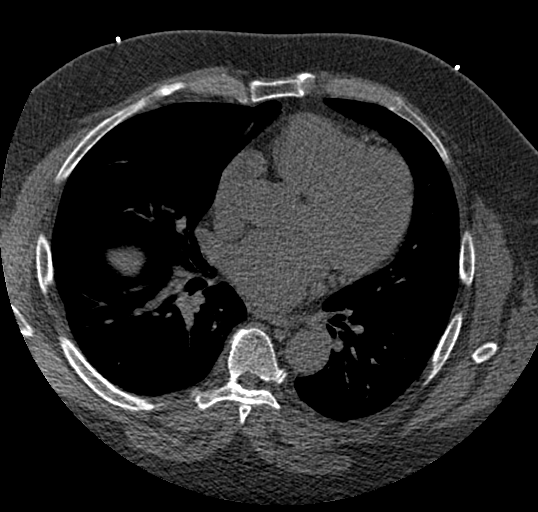
[im 58/70  vessel]
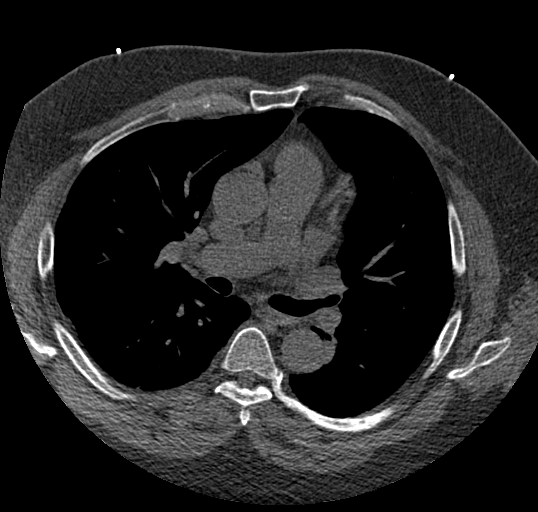
[im 58/70  lung]
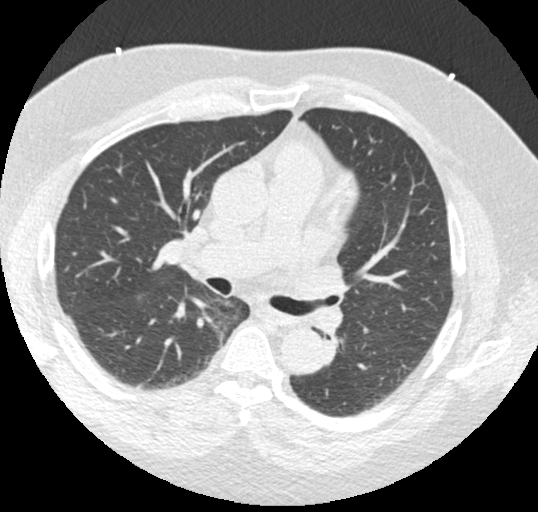

[Series 9: calcium scoring 2.00 br60 bestdiast 71% lungs · axial · 0.72mm/px · z∈[+1622,+1714]mm · 5 of 70 slices shown]
[im 12/70  vessel]
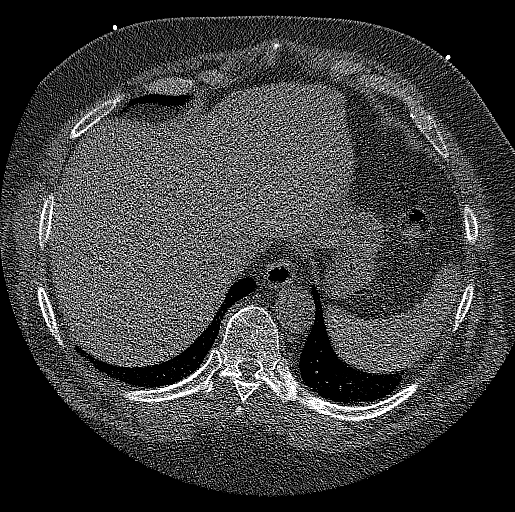
[im 24/70  vessel]
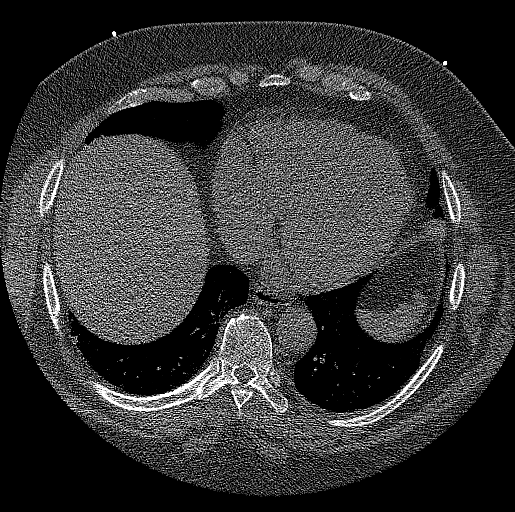
[im 35/70  vessel]
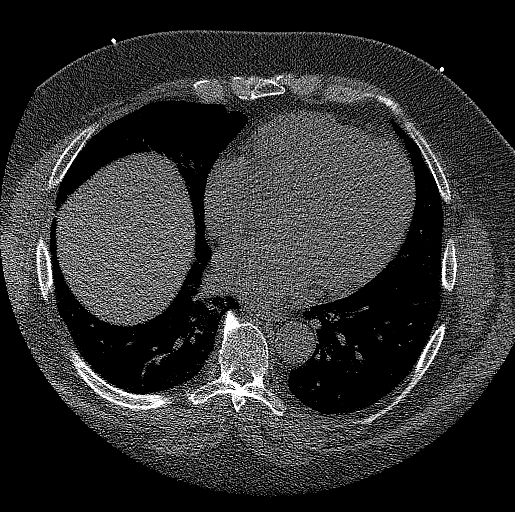
[im 47/70  vessel]
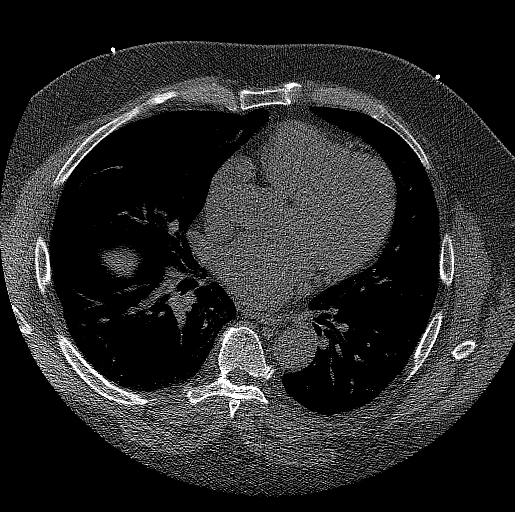
[im 58/70  vessel]
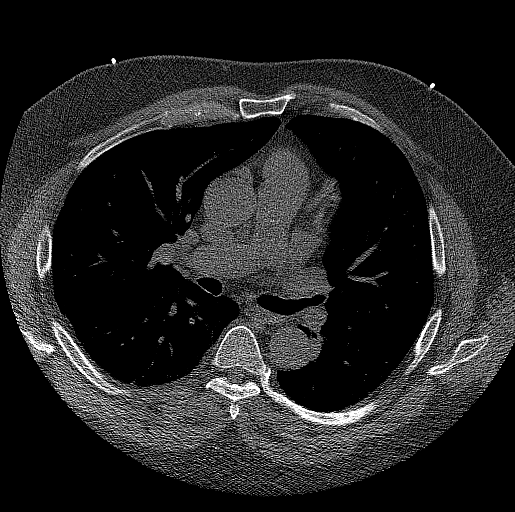

[14 of 20 positions shown; findings below may reference images not displayed]

FINDINGS: CORONARY CALCIUM SCORES:

Left Main: 0

LAD:

LCx: 0

RCA: 0

Total Agatston Score:

[HOSPITAL] percentile: 74th

AORTA MEASUREMENTS:

Ascending Aorta: 38 mm

Descending Aorta: 30 mm

OTHER FINDINGS:

Vascular: Heart is upper limits of normal in size. Normal caliber
thoracic aorta with no significant atherosclerotic disease.

Mediastinum/Nodes: Esophagus is unremarkable. No pathologically
enlarged lymph nodes seen in the chest.

Lungs/Pleura: Central airways are patent. Bibasilar, right middle
lobe, and lingular atelectasis. No consolidation, pleural effusion
or pneumothorax. Small solid 3 mm pulmonary nodule of the left lower
lobe located on series 9, image 12.

Upper Abdomen: No acute abnormality.

Musculoskeletal: No chest wall mass or suspicious bone lesions
identified.
IMPRESSION: 1. Total Agatston Score:
2. [HOSPITAL] percentile: 74th
3. Small solid 3 mm pulmonary nodule of the left lower lobe. No
follow-up needed if patient is low-risk.This recommendation follows
the consensus statement: Guidelines for Management of Incidental
Pulmonary Nodules Detected on CT Images: From the [HOSPITAL]

## 2022-12-28 DIAGNOSIS — Z23 Encounter for immunization: Secondary | ICD-10-CM | POA: Diagnosis not present

## 2022-12-28 DIAGNOSIS — I110 Hypertensive heart disease with heart failure: Secondary | ICD-10-CM | POA: Diagnosis not present

## 2022-12-28 DIAGNOSIS — I5040 Unspecified combined systolic (congestive) and diastolic (congestive) heart failure: Secondary | ICD-10-CM | POA: Diagnosis not present

## 2022-12-28 DIAGNOSIS — I10 Essential (primary) hypertension: Secondary | ICD-10-CM | POA: Diagnosis not present

## 2022-12-28 DIAGNOSIS — Z9989 Dependence on other enabling machines and devices: Secondary | ICD-10-CM | POA: Diagnosis not present

## 2022-12-28 DIAGNOSIS — R7303 Prediabetes: Secondary | ICD-10-CM | POA: Diagnosis not present

## 2022-12-28 DIAGNOSIS — I420 Dilated cardiomyopathy: Secondary | ICD-10-CM | POA: Diagnosis not present

## 2022-12-28 DIAGNOSIS — E261 Secondary hyperaldosteronism: Secondary | ICD-10-CM | POA: Diagnosis not present

## 2023-01-24 DIAGNOSIS — G4733 Obstructive sleep apnea (adult) (pediatric): Secondary | ICD-10-CM | POA: Diagnosis not present

## 2023-03-28 DIAGNOSIS — I10 Essential (primary) hypertension: Secondary | ICD-10-CM | POA: Diagnosis not present

## 2023-03-28 DIAGNOSIS — E785 Hyperlipidemia, unspecified: Secondary | ICD-10-CM | POA: Diagnosis not present

## 2023-03-28 DIAGNOSIS — E261 Secondary hyperaldosteronism: Secondary | ICD-10-CM | POA: Diagnosis not present

## 2023-03-28 DIAGNOSIS — I77810 Thoracic aortic ectasia: Secondary | ICD-10-CM | POA: Diagnosis not present

## 2023-03-28 DIAGNOSIS — R7303 Prediabetes: Secondary | ICD-10-CM | POA: Diagnosis not present

## 2023-03-28 DIAGNOSIS — I5040 Unspecified combined systolic (congestive) and diastolic (congestive) heart failure: Secondary | ICD-10-CM | POA: Diagnosis not present

## 2023-03-28 DIAGNOSIS — I420 Dilated cardiomyopathy: Secondary | ICD-10-CM | POA: Diagnosis not present

## 2023-03-28 DIAGNOSIS — Z136 Encounter for screening for cardiovascular disorders: Secondary | ICD-10-CM | POA: Diagnosis not present

## 2023-03-28 DIAGNOSIS — I2510 Atherosclerotic heart disease of native coronary artery without angina pectoris: Secondary | ICD-10-CM | POA: Diagnosis not present

## 2023-03-28 DIAGNOSIS — Z0000 Encounter for general adult medical examination without abnormal findings: Secondary | ICD-10-CM | POA: Diagnosis not present

## 2023-05-31 DIAGNOSIS — I10 Essential (primary) hypertension: Secondary | ICD-10-CM | POA: Diagnosis not present

## 2023-05-31 DIAGNOSIS — Z1211 Encounter for screening for malignant neoplasm of colon: Secondary | ICD-10-CM | POA: Diagnosis not present

## 2023-05-31 DIAGNOSIS — R7303 Prediabetes: Secondary | ICD-10-CM | POA: Diagnosis not present

## 2023-05-31 DIAGNOSIS — I2510 Atherosclerotic heart disease of native coronary artery without angina pectoris: Secondary | ICD-10-CM | POA: Diagnosis not present

## 2023-07-04 DIAGNOSIS — I429 Cardiomyopathy, unspecified: Secondary | ICD-10-CM | POA: Diagnosis not present

## 2023-07-04 DIAGNOSIS — I509 Heart failure, unspecified: Secondary | ICD-10-CM | POA: Diagnosis not present

## 2023-07-25 DIAGNOSIS — G4733 Obstructive sleep apnea (adult) (pediatric): Secondary | ICD-10-CM | POA: Diagnosis not present

## 2023-07-27 DIAGNOSIS — I10 Essential (primary) hypertension: Secondary | ICD-10-CM | POA: Diagnosis not present

## 2023-07-27 DIAGNOSIS — I5040 Unspecified combined systolic (congestive) and diastolic (congestive) heart failure: Secondary | ICD-10-CM | POA: Diagnosis not present

## 2023-07-27 DIAGNOSIS — I110 Hypertensive heart disease with heart failure: Secondary | ICD-10-CM | POA: Diagnosis not present

## 2023-07-27 DIAGNOSIS — E785 Hyperlipidemia, unspecified: Secondary | ICD-10-CM | POA: Diagnosis not present

## 2023-07-27 DIAGNOSIS — I2510 Atherosclerotic heart disease of native coronary artery without angina pectoris: Secondary | ICD-10-CM | POA: Diagnosis not present

## 2023-08-08 ENCOUNTER — Other Ambulatory Visit: Payer: Self-pay | Admitting: Gastroenterology

## 2023-08-25 ENCOUNTER — Telehealth: Payer: Self-pay

## 2023-08-25 NOTE — Telephone Encounter (Signed)
 OV preop clearance appt has now been scheduled

## 2023-08-25 NOTE — Telephone Encounter (Signed)
   Name: Sean Norman  DOB: [birth year]  MRN: [id number]  Primary Cardiologist: Dr.Tolia  Chart reviewed as part of pre-operative protocol coverage. Because of Sean Norman past medical history and time since last visit, he will require a follow-up in-office visit in order to better assess preoperative cardiovascular risk.  Pre-op covering staff: - Please schedule appointment and call patient to inform them. If patient already had an upcoming appointment within acceptable timeframe, please add pre-op clearance to the appointment notes so provider is aware. - Please contact requesting surgeon's office via preferred method (i.e, phone, fax) to inform them of need for appointment prior to surgery.    Sean Satterfield, NP  08/25/2023, 11:04 AM

## 2023-08-25 NOTE — Telephone Encounter (Signed)
   Pre-operative Risk Assessment    Patient Name: Sean Norman  DOB: March 28, 1955 MRN: [id number]   Date of last office visit: 04/20/22 Date of next office visit: Not scheduled   Request for Surgical Clearance    Procedure:  Right knee arthoscopy  Date of Surgery:  Clearance TBD                                Surgeon:  Dr. Reyes Billing Surgeon's Group or Practice Name:  [facility] Phone number:  [phone] Fax number:  [phone]   Type of Clearance Requested:   - Medical    Type of Anesthesia:  Not Indicated   Additional requests/questions:    [name]   08/25/2023, 7:56 AM

## 2023-09-07 NOTE — Progress Notes (Unsigned)
 Cardiology Office Note   Date:  09/08/2023  ID:  Sean Norman, DOB [birth year], MRN [id number] PCP: Elliot Charm, MD  [facility] Providers Cardiologist:  Madonna Large, DO Cardiology APP:  Vannie Reche RAMAN, NP     History of Present Illness Sean Norman is a 68 y.o. male with hx of NICM, HFrEF, vitiligo, HTN, nonobstructive CAD, obesity.   Echo 04/2021 LVEF 30-35% with mild dilation of LV, gr1dd, aneurysmal interatrial septum, mildly dilated ascending aorta 3.9 cm. Myoview 06/2021 LVEF 25% with no ischemia. Coronary CTA 08/03/21 calcium  score of 61.6 in LAD placing him in 71st percentile for age and sex matched control. Echo 01/2022 with LVEF 30-35%, gr1dd, no significant valvular abnormality.   Previously seen by Dr. Waddell 04/20/22 with recommendation for ICD - he was to call the office if he wished to proceed.  Presents today for preoperative clearance for R knee arthroscopy. Of note, also pending colonoscopy 10/10/23. Previously worked in heavy duty Horticulturist, commercial, has not been able to since back injury. He does stay active achieving >4 METS. BP at home 127/80-90 with upper arm cuff. Notes history of white coat hypertension. Has not taken Amlodipine  in >1 year per his report. He has not taken Farxiga  in some time in at least 2 years. Unclear why or when Entresto  was discontinued. Reports no shortness of breath nor dyspnea on exertion. Reports no chest pain, pressure, or tightness. No edema, orthopnea, PND. Reports no palpitations.    ROS: Please see the history of present illness.    All other systems reviewed and are negative.   Studies Reviewed EKG Interpretation Date/Time:  Friday September 08 2023 08:55:52 EDT Ventricular Rate:  68 PR Interval:  204 QRS Duration:  94 QT Interval:  448 QTC Calculation: 476 R Axis:   -26  Text Interpretation: Normal sinus rhythm Left ventricular hypertrophy Stable inferolateral TWI, improved from prior Prolonged QT Confirmed by Vannie Reche ([id number]) on 09/08/2023 8:59:24 AM    Cardiac Studies & Procedures   ______________________________________________________________________________________________   STRESS TESTS  PCV MYOCARDIAL PERFUSION WO LEXISCAN  06/21/2021  Interpretation Summary Exercise Sestamibi stress test 06/21/2021: Exercise nuclear stress test was performed using Bruce protocol. Patient reached 7 METS, and 87% of age predicted maximum heart rate. Exercise capacity was low. No chest pain reported. Normal heart rate response. Hypertensive response with peak BP 210/108 mmHg. Stress EKG revealed no ischemic changes. Dilated LV cavity with severe global decrease in wall motion and myocardial thickening. Stress LVEF 20%. Decreased tracer uptake in inferior myocardium at both rest and stress, likely due to diaphragmatic attenuation. 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 High risk study due to reduced LVEF.   ECHOCARDIOGRAM  PCV ECHOCARDIOGRAM COMPLETE 01/17/2022  Narrative Echocardiogram 01/17/2022: Left ventricle cavity is moderately dilated. Normal left ventricular wall thickness. Moderate global hypokinesis. LVEF 30-35%. Doppler evidence of grade I (impaired) diastolic dysfunction, normal LAP. Left atrial cavity is mildly dilated. No significant valvular abnormality. No evidence of pulmonary hypertension. Previous study on 04/13/2021 reported ascending aorta 3.9 cm, not appreciated on this study.      CT SCANS  CT CORONARY MORPH W/CTA COR W/SCORE 08/03/2021  Addendum 08/04/2021  6:34 PM ADDENDUM REPORT: 08/04/2021 18:32  HISTORY: Heart failure, known or suspected, initial workup  EXAM: Cardiac/Coronary  CT  TECHNIQUE: The patient was scanned on a [employer].  PROTOCOL: A 120 kV prospective scan was triggered in the descending thoracic aorta at 111 HU's. Axial non-contrast 3 mm slices were carried out through  the heart. The data set was analyzed on a dedicated  work station and scored using the Agatson method. Gantry rotation speed was 250 msecs and collimation was .6 mm. No IV beta blockade but 0.8 mg of sl NTG was given. The 3D data set was reconstructed in 5% intervals of the 35-65 % of the R-R cycle. Diastolic phases were analyzed on a dedicated work station using MPR, MIP and VRT modes. The patient received 100mL OMNIPAQUE  IOHEXOL  350 MG/ML SOLN of contrast.  FINDINGS: Image quality: Average.  Artifact: Slab artifact.  Coronary artery calcification score:  Left main: 0  Left anterior descending artery: 61.6  Left circumflex artery: 0  Right coronary artery: 0  Total coronary calcium  score of 61.6, places the patient at the 71st percentile for age and sex matched control.  Coronary arteries: Normal coronary origins.  Left dominance.  Left Main Coronary Artery: The left main is a normal caliber vessel with a normal take off from the left coronary cusp that bifurcates to form a left anterior descending artery and a left circumflex artery. There is no plaque or stenosis.  Left Anterior Descending Coronary Artery: Normal caliber vessel, wraps the apex, gives off 2 patent diagonal branches.  Minimal calcified plaque within proximal segment otherwise patent vessel.  Left Circumflex Artery: Dominant vessel, gives off 1 patent obtuse marginal branch with superior and inferior branches. The LCX is patent with no evidence of plaque or stenosis.  Right Coronary Artery: Non-dominant vessel, normal take off from the right coronary cusp. There is no evidence of plaque or stenosis.  Left Atrium: Mildly dilated in size with no left atrial appendage filling defect.  Left Ventricle:Mildly dilated in size. There are no stigmata of prior infarction. There is no abnormal filling defect.  Pulmonary arteries: Normal in size without proximal filling defect.  Pulmonary veins: Normal pulmonary venous drainage.  Aorta: Normal size, 39.2 mm  at the mid ascending aorta (level of the PA bifurcation) measured double oblique. No calcifications. No dissection.  Pericardium: Normal thickness with no significant effusion or calcium  present.  Cardiac valves: The aortic valve is trileaflet without significant calcification. The mitral valve is normal structure without significant calcification.  Extra-cardiac findings: See attached radiology report for non-cardiac structures.  IMPRESSION: 1. Total coronary calcium  score of 61.6. This was 71st percentile for age and sex matched control.  2. Normal coronary origin with left dominance.  3. CAD-RADS = 1.  Left Main: Patent.  LAD: Minimal plaque within proximal LAD otherwise patent.  LCx: Patent  RCA: Patent.  RECOMMENDATIONS:  Consider non-atherosclerotic causes of chest pain. Consider preventive therapy and risk factor modification.   Electronically Signed By: Madonna Large D.O. On: 08/04/2021 18:32  Narrative EXAM: OVER-READ INTERPRETATION  CT CHEST  The following report is a limited chest CT over-read performed by radiologist Dr. Reyes Holder of [facility] on 08/03/2021. This over-read does not include interpretation of cardiac or coronary anatomy or pathology. The coronary artery calcium  score/CTA coronary arteries interpretation by the cardiologist is attached.  COMPARISON:  CT May 07, 2021  FINDINGS: Vascular: No acute noncardiac vascular finding.  Mediastinum/Nodes: Within the visualized portions of the thorax there are no pathologically enlarged mediastinal or hilar lymph nodes. The distal esophagus is grossly unremarkable.  Lungs/Pleura: Stable small 3 mm left lower lobe pulmonary nodule on image 8/11 which requires no follow-up if patient is low risk. Bibasilar right middle lobe and lingular atelectasis. No pleural effusion. No pneumothorax.  Upper Abdomen: No acute  abnormality.  Musculoskeletal: No acute osseous abnormality.  Thoracic spondylosis.  IMPRESSION: 1. Stable small 3 mm left lower lobe pulmonary nodule which requires no follow-up. 2. No acute noncardiac cardiopulmonary finding.  Electronically Signed: By: Reyes Holder M.D. On: 08/04/2021 08:14     ______________________________________________________________________________________________      Risk Assessment/Calculations   HYPERTENSION CONTROL Vitals:   09/08/23 0851 09/08/23 0922  BP: (!) 162/100 (!) 142/88    The patient's blood pressure is elevated above target today.  In order to address the patient's elevated BP: The blood pressure is usually elevated in clinic.  Blood pressures monitored at home have been optimal. (Continue to monitor  BP at home)          Physical Exam VS:  BP (!) 142/88   Pulse 68   Ht 6' 3 (1.905 m)   Wt (!) 328 lb (148.8 kg)   SpO2 94%   BMI 41.00 kg/m        Wt Readings from Last 3 Encounters:  09/08/23 (!) 328 lb (148.8 kg)  04/20/22 (!) 318 lb (144.2 kg)  03/10/22 (!) 314 lb 9.6 oz (142.7 kg)    GEN: Well nourished, well developed in no acute distress NECK: No JVD; No carotid bruits CARDIAC: RRR, no murmurs, rubs, gallops RESPIRATORY:  Clear to auscultation without rales, wheezing or rhonchi  ABDOMEN: Soft, non-tender, non-distended EXTREMITIES:  No edema; No deformity   ASSESSMENT AND PLAN  Preop clearance - pending colonoscopy and knee surgery. According to the Revised Cardiac Risk Index (RCRI), his Perioperative Risk of Major Cardiac Event is (%): 6.6. His Functional Capacity in METs is: 5.81 according to the Duke Activity Status Index (DASI). Per AHA/ACC guidelines, he is deemed acceptable risk for the planned procedure without additional cardiovascular testing. Will route to surgical team so they are aware.  May hold Aspirin  7 days prior to planned procedure.  HFrEF / NICM - Euvolemic and well compensated on exam. NYHA I. GDMT Toprol  50mg  daily, Spironolactone  25mg  daily. Unclear  why/when Farxiga  and Entresto  discontinued.  Update echocardiogram to reassess LVEF (may be performed after his surgical procedures). Consider up titration or GDMT and/or re-referral to Dr. Waddell to discuss ICD pending result.  Low sodium diet, fluid restriction <2L, and daily weights encouraged. Educated to contact our office for weight gain of 2 lbs overnight or 5 lbs in one week.   Nonobstructive CAD / HLD, LDL goal <70 - Stable with no anginal symptoms. No indication for ischemic evaluation.  GDMT aspirin  81mg  daily, atorvastatin  40mg  daily, toprol  50mg  daily. Recommend aiming for 150 minutes of moderate intensity activity per week and following a heart healthy diet.  05/2023 LDL 80, wishes to work on lifestyle changes, consider repeat lipid panel at follow up to reassess.   HTN - endorses white coat hypertension. Reports BP controlled at home. Continue present antihypertensive regimen.       Dispo: follow up in 3 months (after echocardiogram)  Signed, Reche GORMAN Finder, NP

## 2023-09-08 ENCOUNTER — Encounter (HOSPITAL_BASED_OUTPATIENT_CLINIC_OR_DEPARTMENT_OTHER): Payer: Self-pay | Admitting: Family

## 2023-09-08 ENCOUNTER — Ambulatory Visit (INDEPENDENT_AMBULATORY_CARE_PROVIDER_SITE_OTHER): Admitting: Family

## 2023-09-08 VITALS — BP 142/88 | HR 68 | Ht 75.0 in | Wt 328.0 lb

## 2023-09-08 DIAGNOSIS — I428 Other cardiomyopathies: Secondary | ICD-10-CM | POA: Diagnosis not present

## 2023-09-08 DIAGNOSIS — I10 Essential (primary) hypertension: Secondary | ICD-10-CM

## 2023-09-08 DIAGNOSIS — Z01810 Encounter for preprocedural cardiovascular examination: Secondary | ICD-10-CM | POA: Diagnosis not present

## 2023-09-08 DIAGNOSIS — I5022 Chronic systolic (congestive) heart failure: Secondary | ICD-10-CM | POA: Diagnosis not present

## 2023-09-08 NOTE — Patient Instructions (Signed)
 Medication Instructions:   Your physician recommends that you continue on your current medications as directed. Please refer to the Current Medication list given to you today.   *If you need a refill on your cardiac medications before your next appointment, please call your pharmacy*  Lab Work:  None ordered.  If you have labs (blood work) drawn today and your tests are completely normal, you will receive your results only by: MyChart Message (if you have MyChart) OR A paper copy in the mail If you have any lab test that is abnormal or we need to change your treatment, we will call you to review the results.  Testing/Procedures:  Your physician has requested that you have an echocardiogram. Echocardiography is a painless test that uses sound waves to create images of your heart. It provides your doctor with information about the size and shape of your heart and how well your heart's chambers and valves are working. This procedure takes approximately one hour. There are no restrictions for this procedure. Please do NOT wear cologne,aftershave, or lotions (deodorant is allowed). Please arrive 15 minutes prior to your appointment time.    Follow-Up: At [facility], you and your health needs are our priority.  As part of our continuing mission to provide you with exceptional heart care, our providers are all part of one team.  This team includes your primary Cardiologist (physician) and Advanced Practice Providers or APPs (Physician Assistants and Nurse Practitioners) who all work together to provide you with the care you need, when you need it.  Your next appointment:   3 month(s)  Provider:   Reche Finder, NP    We recommend signing up for the patient portal called MyChart.  Sign up information is provided on this After Visit Summary.  MyChart is used to connect with patients for Virtual Visits (Telemedicine).  Patients are able to view lab/test results, encounter notes,  upcoming appointments, etc.  Non-urgent messages can be sent to your provider as well.   To learn more about what you can do with MyChart, go to [email or web address].   Other Instructions  YOU CAN HOLD YOUR ASPRIN FOR 7 DAYS PRIOR TO YOUR COLONOSCOPY AND KNEE SURGERY.

## 2023-09-28 ENCOUNTER — Ambulatory Visit: Payer: Self-pay | Admitting: Orthopedic Surgery

## 2023-10-03 ENCOUNTER — Encounter (HOSPITAL_COMMUNITY): Payer: Self-pay | Admitting: Gastroenterology

## 2023-10-10 ENCOUNTER — Ambulatory Visit (HOSPITAL_BASED_OUTPATIENT_CLINIC_OR_DEPARTMENT_OTHER): Admitting: Certified Registered"

## 2023-10-10 ENCOUNTER — Ambulatory Visit (HOSPITAL_COMMUNITY)
Admission: RE | Admit: 2023-10-10 | Discharge: 2023-10-10 | Disposition: A | Discharge status: Home / Self Care | Attending: Gastroenterology | Admitting: Gastroenterology

## 2023-10-10 ENCOUNTER — Encounter (HOSPITAL_COMMUNITY)
Admission: RE | Disposition: A | Discharge status: Home / Self Care | Payer: Self-pay | Source: Home / Self Care | Attending: Gastroenterology

## 2023-10-10 ENCOUNTER — Ambulatory Visit (HOSPITAL_COMMUNITY): Admitting: Certified Registered"

## 2023-10-10 ENCOUNTER — Other Ambulatory Visit: Payer: Self-pay

## 2023-10-10 DIAGNOSIS — E6689 Other obesity not elsewhere classified: Secondary | ICD-10-CM | POA: Diagnosis not present

## 2023-10-10 DIAGNOSIS — K648 Other hemorrhoids: Secondary | ICD-10-CM | POA: Diagnosis not present

## 2023-10-10 DIAGNOSIS — K5140 Inflammatory polyps of colon without complications: Secondary | ICD-10-CM | POA: Insufficient documentation

## 2023-10-10 DIAGNOSIS — I110 Hypertensive heart disease with heart failure: Secondary | ICD-10-CM | POA: Diagnosis not present

## 2023-10-10 DIAGNOSIS — D123 Benign neoplasm of transverse colon: Secondary | ICD-10-CM | POA: Diagnosis not present

## 2023-10-10 DIAGNOSIS — I420 Dilated cardiomyopathy: Secondary | ICD-10-CM | POA: Insufficient documentation

## 2023-10-10 DIAGNOSIS — K5730 Diverticulosis of large intestine without perforation or abscess without bleeding: Secondary | ICD-10-CM | POA: Diagnosis not present

## 2023-10-10 DIAGNOSIS — Z6841 Body Mass Index (BMI) 40.0 and over, adult: Secondary | ICD-10-CM | POA: Insufficient documentation

## 2023-10-10 DIAGNOSIS — Z79899 Other long term (current) drug therapy: Secondary | ICD-10-CM | POA: Diagnosis not present

## 2023-10-10 DIAGNOSIS — Z1211 Encounter for screening for malignant neoplasm of colon: Secondary | ICD-10-CM

## 2023-10-10 DIAGNOSIS — I2510 Atherosclerotic heart disease of native coronary artery without angina pectoris: Secondary | ICD-10-CM | POA: Diagnosis not present

## 2023-10-10 DIAGNOSIS — D120 Benign neoplasm of cecum: Secondary | ICD-10-CM

## 2023-10-10 DIAGNOSIS — I509 Heart failure, unspecified: Secondary | ICD-10-CM | POA: Insufficient documentation

## 2023-10-10 HISTORY — PX: COLONOSCOPY: SHX5424

## 2023-10-10 SURGERY — COLONOSCOPY
Anesthesia: Monitor Anesthesia Care

## 2023-10-10 MED ORDER — PROPOFOL 1000 MG/100ML IV EMUL
INTRAVENOUS | Status: AC
  Filled 2023-10-10: qty 100

## 2023-10-10 MED ORDER — SODIUM CHLORIDE 0.9 % IV SOLN: INTRAVENOUS | Status: DC

## 2023-10-10 MED ORDER — LIDOCAINE 2% (20 MG/ML) 5 ML SYRINGE
INTRAMUSCULAR | Status: DC | PRN
  Administered 2023-10-10: 100 mg via INTRAVENOUS

## 2023-10-10 MED ORDER — PROPOFOL 10 MG/ML IV BOLUS
INTRAVENOUS | Status: DC | PRN
  Administered 2023-10-10: 40 mg via INTRAVENOUS

## 2023-10-10 MED ORDER — PROPOFOL 500 MG/50ML IV EMUL
INTRAVENOUS | Status: DC | PRN
  Administered 2023-10-10: 125 ug/kg/min via INTRAVENOUS

## 2023-10-10 NOTE — Anesthesia Procedure Notes (Signed)
 Procedure Name: MAC Date/Time: 10/10/2023 8:44 AM  Performed by: Metta Andrea NOVAK, CRNAPre-anesthesia Checklist: Patient identified, Emergency Drugs available, Suction available, Patient being monitored and Timeout performed Oxygen Delivery Method: Simple face mask Placement Confirmation: positive ETCO2

## 2023-10-10 NOTE — Anesthesia Preprocedure Evaluation (Addendum)
 Anesthesia Evaluation  Patient identified by MRN, date of birth, ID band Patient awake    Reviewed: Allergy & Precautions, NPO status , Patient's Chart, lab work & pertinent test results, reviewed documented beta blocker date and time   Airway Mallampati: I  TM Distance: >3 FB Neck ROM: Full    Dental no notable dental hx. (+) Teeth Intact, Dental Advisory Given   Pulmonary neg pulmonary ROS   Pulmonary exam normal breath sounds clear to auscultation       Cardiovascular hypertension, Pt. on medications and Pt. on home beta blockers + CAD and +CHF  Normal cardiovascular exam Rhythm:Regular Rate:Normal  Echocardiogram 01/17/2022: Left ventricle cavity is moderately dilated. Normal left ventricular wall thickness. Moderate global hypokinesis. LVEF 30-35%. Doppler evidence of grade I (impaired) diastolic dysfunction, normal LAP. Left atrial cavity is mildly dilated. No significant valvular abnormality. No evidence of pulmonary hypertension.     Neuro/Psych negative neurological ROS  negative psych ROS   GI/Hepatic negative GI ROS, Neg liver ROS,,,  Endo/Other    Class 4 obesity (BMI 41)  Renal/GU negative Renal ROS  negative genitourinary   Musculoskeletal negative musculoskeletal ROS (+)    Abdominal   Peds  Hematology negative hematology ROS (+)   Anesthesia Other Findings   Reproductive/Obstetrics                              Anesthesia Physical Anesthesia Plan  ASA: 3  Anesthesia Plan: MAC   Post-op Pain Management:    Induction: Intravenous  PONV Risk Score and Plan: Propofol  infusion and Treatment may vary due to age or medical condition  Airway Management Planned: Natural Airway  Additional Equipment:   Intra-op Plan:   Post-operative Plan:   Informed Consent: I have reviewed the patients History and Physical, chart, labs and discussed the procedure including the  risks, benefits and alternatives for the proposed anesthesia with the patient or authorized representative who has indicated his/her understanding and acceptance.     Dental advisory given  Plan Discussed with: CRNA  Anesthesia Plan Comments:          Anesthesia Quick Evaluation

## 2023-10-10 NOTE — Transfer of Care (Signed)
 Immediate Anesthesia Transfer of Care Note  Patient: Sean Norman  Procedure(s) Performed: COLONOSCOPY  Patient Location: PACU and Endoscopy Unit  Anesthesia Type:MAC  Level of Consciousness: drowsy and responds to stimulation  Airway & Oxygen Therapy: Patient Spontanous Breathing and Patient connected to face mask oxygen  Post-op Assessment: Report given to RN and Post -op Vital signs reviewed and stable  Post vital signs: Reviewed and stable  Last Vitals:  Vitals Value Taken Time  BP 98/71   Temp    Pulse 80   Resp 14   SpO2 97     Last Pain:  Vitals:   10/10/23 0822  TempSrc: Temporal  PainSc: 0-No pain         Complications: No notable events documented.

## 2023-10-10 NOTE — Discharge Instructions (Signed)
 YOU HAD AN ENDOSCOPIC PROCEDURE TODAY: Refer to the procedure report and other information in the discharge instructions given to you for any specific questions about what was found during the examination. If this information does not answer your questions, please call the [facility] office at [phone] to clarify.   YOU SHOULD EXPECT: Some feelings of bloating in the abdomen. Passage of more gas than usual. Walking can help get rid of the air that was put into your GI tract during the procedure and reduce the bloating. If you had a lower endoscopy (such as a colonoscopy or flexible sigmoidoscopy) you may notice spotting of blood in your stool or on the toilet paper. Some abdominal soreness may be present for a day or two, also.  DIET: Your first meal following the procedure should be a light meal and then it is ok to progress to your normal diet. A half-sandwich or bowl of soup is an example of a good first meal. Heavy or fried foods are harder to digest and may make you feel nauseous or bloated. Drink plenty of fluids but you should avoid alcoholic beverages for 24 hours.    ACTIVITY: Your care partner should take you home directly after the procedure. You should plan to take it easy, moving slowly for the rest of the day. You can resume normal activity the day after the procedure however YOU SHOULD NOT DRIVE, use power tools, machinery or perform tasks that involve climbing or major physical exertion for 24 hours (because of the sedation medicines used during the test).   SYMPTOMS TO REPORT IMMEDIATELY: A gastroenterologist can be reached at any hour. Please call [phone]  for any of the following symptoms:  Following lower endoscopy (colonoscopy, flexible sigmoidoscopy) Excessive amounts of blood in the stool  Significant tenderness, worsening of abdominal pains  Swelling of the abdomen that is new, acute  Fever of 100 or higher    FOLLOW UP:  If any biopsies were taken you will be  contacted by phone or by letter within the next 1-3 weeks. Call [phone]  if you have not heard about the biopsies in 3 weeks.  Please also call with any specific questions about appointments or follow up tests.

## 2023-10-10 NOTE — Anesthesia Postprocedure Evaluation (Signed)
 Anesthesia Post Note  Patient: Sean Norman  Procedure(s) Performed: COLONOSCOPY     Patient location during evaluation: Endoscopy Anesthesia Type: MAC Level of consciousness: awake and alert Pain management: pain level controlled Vital Signs Assessment: post-procedure vital signs reviewed and stable Respiratory status: spontaneous breathing, nonlabored ventilation, respiratory function stable and patient connected to nasal cannula oxygen Cardiovascular status: blood pressure returned to baseline and stable Postop Assessment: no apparent nausea or vomiting Anesthetic complications: no   No notable events documented.  Last Vitals:  Vitals:   10/10/23 0930 10/10/23 0940  BP: 132/89 (!) 138/104  Pulse: 86 74  Resp: (!) 24 (!) 23  Temp:    SpO2: 94% 93%    Last Pain:  Vitals:   10/10/23 0940  TempSrc:   PainSc: 0-No pain                 Caraline Deutschman L Missey Hasley

## 2023-10-10 NOTE — H&P (Signed)
 Primary Care Physician:  Elliot Charm, MD Primary Gastroenterologist:  Dr. Elicia  Reason for visit -screening colonoscopy  HPI: Sean Norman is a 68 y.o. male with past medical history of dilated cardiomyopathy with EF of 30 to 35%, history of CHF here for outpatient surveillance colonoscopy.  Last colonoscopy around 10 years ago at outside facility.  No family history of colon cancer.  Denies any GI symptoms.  Past Medical History:  Diagnosis Date   Allergic rhinitis 05/02/2019   Cardiomyopathy (HCC)    CHF (congestive heart failure) (HCC)    Chicken pox    Coronary artery calcification    Hyperlipidemia    Hypertension    Vitiligo     Past Surgical History:  Procedure Laterality Date   COLONOSCOPY  2014   in [place]-polyp per pt   KNEE ARTHROSCOPY WITH LATERAL MENISECTOMY Left 09/22/2021   Procedure: KNEE ARTHROSCOPY WITH MEDIAL AND LATERAL MENISECTOMY AND DEBRIDEMENT;  Surgeon: Duwayne Purchase, MD;  Location: [facility];  Service: Orthopedics;  Laterality: Left;   NO PAST SURGERIES     POLYPECTOMY      Prior to Admission medications   Medication Sig Start Date End Date Taking? Authorizing Provider  aspirin  EC 81 MG tablet Take 1 tablet (81 mg total) by mouth daily. Swallow whole. 06/02/21   Tolia, Sunit, DO  atorvastatin  (LIPITOR) 40 MG tablet Take 1 tablet (40 mg total) by mouth at bedtime. 03/31/21 09/08/23  Tolia, Sunit, DO  hydrochlorothiazide  (HYDRODIURIL ) 25 MG tablet Take 25 mg by mouth every morning. 04/12/22   [provider]  metoprolol  succinate (TOPROL -XL) 50 MG 24 hr tablet TAKE 1 TABLET(50 MG) BY MOUTH EVERY MORNING 06/22/22   Tolia, Sunit, DO  spironolactone  (ALDACTONE ) 25 MG tablet TAKE 1 TABLET(25 MG) BY MOUTH EVERY MORNING 06/22/22   Tolia, Sunit, DO    Scheduled Meds: Continuous Infusions: PRN Meds:.  Allergies as of 08/08/2023   (No Known Allergies)    Family History  Problem Relation Age of Onset   Heart disease Mother     Hypertension Mother    Kidney disease Mother    Diabetes Mother    Heart disease Father    Stroke Father    Hypertension Father    Breast cancer Maternal Grandmother    Hypertension Maternal Grandmother    Heart disease Maternal Grandmother    Hypertension Maternal Grandfather    Stroke Maternal Grandfather    Heart disease Maternal Grandfather    Diabetes Paternal Grandfather    Colon cancer Neg Hx    Colon polyps Neg Hx    Esophageal cancer Neg Hx    Rectal cancer Neg Hx    Stomach cancer Neg Hx     Social History   Socioeconomic History   Marital status: Married    Spouse name: Not on file   Number of children: 5   Years of education: Not on file   Highest education level: Not on file  Occupational History   Not on file  Tobacco Use   Smoking status: Never   Smokeless tobacco: Never  Vaping Use   Vaping status: Never Used  Substance and Sexual Activity   Alcohol use: No   Drug use: No   Sexual activity: Not on file  Other Topics Concern   Not on file  Social History Narrative   Not on file   Social Drivers of Health   Financial Resource Strain: Not on file  Food Insecurity: Not on file  Transportation Needs: Not on  file  Physical Activity: Not on file  Stress: Not on file  Social Connections: Unknown (05/25/2021)   Received from [facility]   Social Network    Social Network: Not on file  Intimate Partner Violence: Unknown (04/16/2021)   Received from [facility]   HITS    Physically Hurt: Not on file    Insult or Talk Down To: Not on file    Threaten Physical Harm: Not on file    Scream or Curse: Not on file    Review of Systems: All negative except as stated above in HPI.  Physical Exam: Vital signs: There were no vitals filed for this visit.   General:   Alert,  Well-developed, well-nourished, pleasant and cooperative in NAD Lungs: No visible respiratory distress Heart:  Regular rate and rhythm; no murmurs, clicks, rubs,  or  gallops. Abdomen: Soft, nontender, nondistended, bowel sound present, no peritoneal signs Rectal:  Deferred  GI:  Lab Results: No results for input(s): WBC, HGB, HCT, PLT in the last 72 hours. BMET No results for input(s): NA, K, CL, CO2, GLUCOSE, BUN, CREATININE, CALCIUM  in the last 72 hours. LFT No results for input(s): PROT, ALBUMIN, AST, ALT, ALKPHOS, BILITOT, BILIDIR, IBILI in the last 72 hours. PT/INR No results for input(s): LABPROT, INR in the last 72 hours.   Studies/Results: No results found.  Impression/Plan: -Colon cancer screening - Dilated cardiomyopathy with EF of 30 to 35%  Recommendations ------------------------- - Proceed with colonoscopy today.  Risks (bleeding, infection, bowel perforation that could require surgery, sedation-related changes in cardiopulmonary systems), benefits (identification and possible treatment of source of symptoms, exclusion of certain causes of symptoms), and alternatives (watchful waiting, radiographic imaging studies, empiric medical treatment)  were explained to patient/family in detail and patient wishes to proceed.     LOS: 0 days   [name]  MD, FACP 10/10/2023, 7:53 AM  Contact #  [phone]

## 2023-10-10 NOTE — Op Note (Signed)
 [facility] Patient Name: Sean Norman Procedure Date: 10/10/2023 MRN: [id number] Attending MD: [name] , MD, [id number] Date of Birth: 1955/03/02 CSN: [id number] Age: 68 Admit Type: Outpatient Procedure:                Colonoscopy Indications:              Screening for colorectal malignant neoplasm, Last                            colonoscopy 10 years ago Providers:                [name], MD, Randall Lines, RN, Felice Sar,                            Technician Referring MD:              Medicines:                Sedation Administered by an Anesthesia Professional Complications:            No immediate complications. Estimated Blood Loss:     Estimated blood loss was minimal. Procedure:                Pre-Anesthesia Assessment:                           - Prior to the procedure, a History and Physical                            was performed, and patient medications and                            allergies were reviewed. The patient's tolerance of                            previous anesthesia was also reviewed. The risks                            and benefits of the procedure and the sedation                            options and risks were discussed with the patient.                            All questions were answered, and informed consent                            was obtained. Prior Anticoagulants: The patient has                            taken no anticoagulant or antiplatelet agents. ASA                            Grade Assessment: III - A patient with severe  systemic disease. After reviewing the risks and                            benefits, the patient was deemed in satisfactory                            condition to undergo the procedure.                           After obtaining informed consent, the colonoscope                            was passed under direct vision. Throughout the                             procedure, the patient's blood pressure, pulse, and                            oxygen saturations were monitored continuously. The                            PCF-HQ190DL ([id number]) Olympus Colonoscope was                            introduced through the anus and advanced to the the                            cecum, identified by appendiceal orifice and                            ileocecal valve. The colonoscopy was performed with                            moderate difficulty due to a tortuous colon.                            Successful completion of the procedure was aided by                            applying abdominal pressure. The patient tolerated                            the procedure well. The quality of the bowel                            preparation was adequate to identify polyps greater                            than 5 mm in size. The ileocecal valve, appendiceal                            orifice, and rectum were photographed. Scope In: 8:49:33 AM Scope Out: 9:11:14 AM Scope Withdrawal Time: 0 hours 16 minutes 22 seconds  Total  Procedure Duration: 0 hours 21 minutes 41 seconds  Findings:      Hemorrhoids were found on perianal exam.      An 8 mm polyp was found in the cecum. The polyp was sessile. The polyp       was removed with a cold snare. Resection and retrieval were complete.      A 3 mm polyp was found in the transverse colon. The polyp was sessile.       The polyp was removed with a cold snare. Resection and retrieval were       complete.      Scattered diverticula were found in the entire colon.      Internal hemorrhoids were found during retroflexion. The hemorrhoids       were small. Impression:               - Hemorrhoids found on perianal exam.                           - One 8 mm polyp in the cecum, removed with a cold                            snare. Resected and retrieved.                           - One 3 mm polyp in the transverse colon,  removed                            with a cold snare. Resected and retrieved.                           - Diverticulosis in the entire examined colon.                           - Internal hemorrhoids. Moderate Sedation:      Moderate (conscious) sedation was personally administered by an       anesthesia professional. The following parameters were monitored: oxygen       saturation, heart rate, blood pressure, and response to care. Recommendation:           - Patient has a contact number available for                            emergencies. The signs and symptoms of potential                            delayed complications were discussed with the                            patient. Return to normal activities tomorrow.                            Written discharge instructions were provided to the                            patient.                           -  Resume previous diet.                           - Continue present medications.                           - Await pathology results.                           - Repeat colonoscopy date to be determined after                            pending pathology results are reviewed for                            surveillance based on pathology results.                           - No ibuprofen , naproxen, or other non-steroidal                            anti-inflammatory drugs for 2 weeks after polyp                            removal. Procedure Code(s):        --- Professional ---                           [phone], Colonoscopy, flexible; with removal of                            tumor(s), polyp(s), or other lesion(s) by snare                            technique Diagnosis Code(s):        --- Professional ---                           Z12.11, Encounter for screening for malignant                            neoplasm of colon                           D12.0, Benign neoplasm of cecum                           D12.3, Benign neoplasm of  transverse colon (hepatic                            flexure or splenic flexure)                           K64.8, Other hemorrhoids                           K57.30, Diverticulosis of large intestine without  perforation or abscess without bleeding CPT copyright 2022 American Medical Association. All rights reserved. The codes documented in this report are preliminary and upon coder review may  be revised to meet current compliance requirements. [name], MD [name], MD 10/10/2023 9:26:16 AM Number of Addenda: 0

## 2023-10-12 ENCOUNTER — Encounter (HOSPITAL_COMMUNITY): Payer: Self-pay | Admitting: Gastroenterology

## 2023-10-12 ENCOUNTER — Other Ambulatory Visit (HOSPITAL_COMMUNITY): Payer: Self-pay

## 2023-10-12 LAB — SURGICAL PATHOLOGY

## 2023-10-16 ENCOUNTER — Ambulatory Visit (INDEPENDENT_AMBULATORY_CARE_PROVIDER_SITE_OTHER)

## 2023-10-16 ENCOUNTER — Ambulatory Visit: Payer: Self-pay | Admitting: Orthopedic Surgery

## 2023-10-16 DIAGNOSIS — I428 Other cardiomyopathies: Secondary | ICD-10-CM | POA: Diagnosis not present

## 2023-10-16 DIAGNOSIS — I10 Essential (primary) hypertension: Secondary | ICD-10-CM

## 2023-10-16 DIAGNOSIS — Z01810 Encounter for preprocedural cardiovascular examination: Secondary | ICD-10-CM

## 2023-10-16 DIAGNOSIS — I5022 Chronic systolic (congestive) heart failure: Secondary | ICD-10-CM | POA: Diagnosis not present

## 2023-10-16 LAB — ECHOCARDIOGRAM COMPLETE
AR max vel: 2.31 cm2
AV Area VTI: 2.06 cm2
AV Area mean vel: 1.66 cm2
AV Mean grad: 4 mmHg
AV Peak grad: 7.6 mmHg
Ao pk vel: 1.38 m/s
Area-P 1/2: 3.53 cm2
Calc EF: 42.4 %
S' Lateral: 4.35 cm
Single Plane A2C EF: 33.3 %
Single Plane A4C EF: 48.8 %

## 2023-10-16 NOTE — H&P (Signed)
 Sean Norman is an 68 y.o. male.   Chief Complaint: right knee pain HPI: Reason for Visit: Diagnositc Results (lumbar and right knee MRIs) Context: work injury; 3 years and 3 months 04/10/20 Location (Lower Extremity): lower back pain on the right; knee pain Severity: pain level 9/10 (back and 8/10 right knee) Quality: aching; burning Aggravating Factors: standing for 30 min Associated Symptoms: numbness/tingling (RLE); instability and popping right knee Are you working? 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 Medications: Hydrocodone  prn  Past Medical History:  Diagnosis Date   Allergic rhinitis 05/02/2019   Cardiomyopathy (HCC)    CHF (congestive heart failure) (HCC)    Chicken pox    Coronary artery calcification    Hyperlipidemia    Hypertension    Vitiligo     Past Surgical History:  Procedure Laterality Date   COLONOSCOPY  2014   in [place]-polyp per pt   COLONOSCOPY N/A 10/10/2023   Procedure: COLONOSCOPY;  Surgeon: Elicia Claw, MD;  Location: [facility];  Service: Gastroenterology;  Laterality: N/A;   KNEE ARTHROSCOPY WITH LATERAL MENISECTOMY Left 09/22/2021   Procedure: KNEE ARTHROSCOPY WITH MEDIAL AND LATERAL MENISECTOMY AND DEBRIDEMENT;  Surgeon: Duwayne Purchase, MD;  Location: [facility];  Service: Orthopedics;  Laterality: Left;   NO PAST SURGERIES     POLYPECTOMY      Family History  Problem Relation Age of Onset   Heart disease Mother    Hypertension Mother    Kidney disease Mother    Diabetes Mother    Heart disease Father    Stroke Father    Hypertension Father    Breast cancer Maternal Grandmother    Hypertension Maternal Grandmother    Heart disease Maternal Grandmother    Hypertension Maternal Grandfather    Stroke Maternal Grandfather    Heart disease Maternal Grandfather    Diabetes Paternal Grandfather    Colon cancer Neg Hx    Colon polyps Neg Hx    Esophageal cancer Neg Hx    Rectal cancer Neg Hx    Stomach cancer Neg Hx    Social  History:  reports that he has never smoked. He has never used smokeless tobacco. He reports that he does not drink alcohol and does not use drugs.  Allergies: No Known Allergies  (Not in a hospital admission)   No results found for this or any previous visit (from the past 48 hours). No results found.  Review of Systems  Constitutional: Negative.   HENT: Negative.    Eyes: Negative.   Respiratory: Negative.    Cardiovascular: Negative.   Gastrointestinal: Negative.   Endocrine: Negative.   Genitourinary: Negative.   Musculoskeletal:  Positive for arthralgias, gait problem, joint swelling and myalgias.  Skin: Negative.   Psychiatric/Behavioral: Negative.      There were no vitals taken for this visit. Physical Exam Constitutional:      Appearance: Normal appearance.  HENT:     Head: Normocephalic and atraumatic.     Right Ear: External ear normal.     Left Ear: External ear normal.     Nose: Nose normal.     Mouth/Throat:     Pharynx: Oropharynx is clear.  Eyes:     Conjunctiva/sclera: Conjunctivae normal.     Pupils: Pupils are equal, round, and reactive to light.  Cardiovascular:     Rate and Rhythm: Normal rate.     Pulses: Normal pulses.  Pulmonary:     Effort: Pulmonary effort is normal.  Abdominal:  General: Abdomen is flat.  Musculoskeletal:     Cervical back: Normal range of motion.     Comments: Examination the right knee is tender medial joint line patellofemoral pain compression range 0 140 no instability. Mild effusion.  Skin:    General: Skin is warm and dry.  Neurological:     Mental Status: He is alert.    MRI of the right knee demonstrates nondisplaced undersurface tear of the medial meniscus body and posterior horn. Nondisplaced horizontal tear of the lateral meniscus and posterior horn high-grade patellofemoral arthrosis and mild lateral compartment arthrosis. Mild patellar tendinosis  Assessment/Plan  Impression:  1. Persistent back pain  possibly facet mediated lumbar spine with persistent symptoms with no evidence of neural compressive lesion by MRI. Facet arthrosis L5-S1 with surrounding edema. Not present on the previous MRI. Underlying edema in the pars without fracture. 2. Right knee pain status post a fall with likely aggravation of his underlying osteoarthritis with medial lateral meniscus tears and high-grade patellofemoral arthrosis 3. Status post knee arthroscopy on the left partial meniscectomy and debridement  Plan:   At this point we discussed options. Living with his current symptomatology. He reports the knee is giving way on him and has mechanical symptoms of locking. Given the meniscal pathology it is reasonable to proceed with a knee arthroscopy partial medial lateral meniscectomies if noted. However indicated that cartilage cannot be put back on the end of the bone and the severe facet arthrosis would remain. He does not have significant arthritis in the medial lateral compartments and therefore I would not recommend a knee replacement. Given these findings I would adjust his current restrictions to sedentary. With the anticipation of returning to a medium job classification at the conclusion of his treatment for his knee. With ongoing symptoms since his initial injury I feel this is related to his initial injury. As is his patellofemoral arthrosis  I discussed the risk and benefits of knee arthroscopy including no changes in their symptoms worsening in their symptoms DVT, PE, anesthetic complications etc. Surgical possibilities include chondroplasty, microfracture, partial meniscectomy, plica excision etc. We also discussed the possible need for repeat arthroscopy in the future as well as possible continued treatment including corticosteroid injections and possible Visco supplementation. In addition we discussed the possibility of even eventually required a total knee replacement if significant arthritis encountered.  Also indicating that it is an outpatient procedure. 1-2 days on crutches. Postoperative DVT prophylaxis with aspirin  if tolerated. Follow-up in the office in 2 weeks following the surgery. Possible consideration of formal of supervised physical therapy as well.  In terms of his lumbar spine. Again he has well-maintained disc spaces. Likely symptomatic from his facet arthrosis. He reports ongoing symptoms across the lower part of his back. It is reasonable to have him undergo facet injections at L5-S1. And if only temporarily effective we discussed RFA. Avoiding twisting and loading the facet joints. Continue physical therapy in the interim.  A TENS unit would be helpful as that was helpful in physical therapy.  A prescription was given for a muscle relaxer to be taken for the reduction of muscle spasms. They are typically better to be taken at night when the patient is at rest as they do cause somnolence or grogginess. When they are taken during the day they typically will interfere with the function of muscles that are typically required for core support and functional activity. Therefore, they can significantly impair one's ability to for example operate machines such as  motor vehicles and Biomedical engineer. Again best taken not during the day.  Oxycodone  postoperatively 5 mg.. Aspirin  for DVT prophylaxis. No history of DVT or MRSA.  Plan Right knee scope, partial medial and lateral meniscectomy, debridement  Darice CHRISTELLA Randy, PA-C for Dr Duwayne 10/16/2023, 11:42 AM

## 2023-10-16 NOTE — H&P (View-Only) (Signed)
 Marven Veley is an 68 y.o. male.   Chief Complaint: right knee pain HPI: Reason for Visit: Diagnositc Results (lumbar and right knee MRIs) Context: work injury; 3 years and 3 months 04/10/20 Location (Lower Extremity): lower back pain on the right; knee pain Severity: pain level 9/10 (back and 8/10 right knee) Quality: aching; burning Aggravating Factors: standing for 30 min Associated Symptoms: numbness/tingling (RLE); instability and popping right knee Are you working? 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 Medications: Hydrocodone  prn  Past Medical History:  Diagnosis Date   Allergic rhinitis 05/02/2019   Cardiomyopathy (HCC)    CHF (congestive heart failure) (HCC)    Chicken pox    Coronary artery calcification    Hyperlipidemia    Hypertension    Vitiligo     Past Surgical History:  Procedure Laterality Date   COLONOSCOPY  2014   in [place]-polyp per pt   COLONOSCOPY N/A 10/10/2023   Procedure: COLONOSCOPY;  Surgeon: Elicia Claw, MD;  Location: [facility];  Service: Gastroenterology;  Laterality: N/A;   KNEE ARTHROSCOPY WITH LATERAL MENISECTOMY Left 09/22/2021   Procedure: KNEE ARTHROSCOPY WITH MEDIAL AND LATERAL MENISECTOMY AND DEBRIDEMENT;  Surgeon: Duwayne Purchase, MD;  Location: [facility];  Service: Orthopedics;  Laterality: Left;   NO PAST SURGERIES     POLYPECTOMY      Family History  Problem Relation Age of Onset   Heart disease Mother    Hypertension Mother    Kidney disease Mother    Diabetes Mother    Heart disease Father    Stroke Father    Hypertension Father    Breast cancer Maternal Grandmother    Hypertension Maternal Grandmother    Heart disease Maternal Grandmother    Hypertension Maternal Grandfather    Stroke Maternal Grandfather    Heart disease Maternal Grandfather    Diabetes Paternal Grandfather    Colon cancer Neg Hx    Colon polyps Neg Hx    Esophageal cancer Neg Hx    Rectal cancer Neg Hx    Stomach cancer Neg Hx    Social  History:  reports that he has never smoked. He has never used smokeless tobacco. He reports that he does not drink alcohol and does not use drugs.  Allergies: No Known Allergies  (Not in a hospital admission)   No results found for this or any previous visit (from the past 48 hours). No results found.  Review of Systems  Constitutional: Negative.   HENT: Negative.    Eyes: Negative.   Respiratory: Negative.    Cardiovascular: Negative.   Gastrointestinal: Negative.   Endocrine: Negative.   Genitourinary: Negative.   Musculoskeletal:  Positive for arthralgias, gait problem, joint swelling and myalgias.  Skin: Negative.   Psychiatric/Behavioral: Negative.      There were no vitals taken for this visit. Physical Exam Constitutional:      Appearance: Normal appearance.  HENT:     Head: Normocephalic and atraumatic.     Right Ear: External ear normal.     Left Ear: External ear normal.     Nose: Nose normal.     Mouth/Throat:     Pharynx: Oropharynx is clear.  Eyes:     Conjunctiva/sclera: Conjunctivae normal.     Pupils: Pupils are equal, round, and reactive to light.  Cardiovascular:     Rate and Rhythm: Normal rate.     Pulses: Normal pulses.  Pulmonary:     Effort: Pulmonary effort is normal.  Abdominal:  General: Abdomen is flat.  Musculoskeletal:     Cervical back: Normal range of motion.     Comments: Examination the right knee is tender medial joint line patellofemoral pain compression range 0 140 no instability. Mild effusion.  Skin:    General: Skin is warm and dry.  Neurological:     Mental Status: He is alert.    MRI of the right knee demonstrates nondisplaced undersurface tear of the medial meniscus body and posterior horn. Nondisplaced horizontal tear of the lateral meniscus and posterior horn high-grade patellofemoral arthrosis and mild lateral compartment arthrosis. Mild patellar tendinosis  Assessment/Plan  Impression:  1. Persistent back pain  possibly facet mediated lumbar spine with persistent symptoms with no evidence of neural compressive lesion by MRI. Facet arthrosis L5-S1 with surrounding edema. Not present on the previous MRI. Underlying edema in the pars without fracture. 2. Right knee pain status post a fall with likely aggravation of his underlying osteoarthritis with medial lateral meniscus tears and high-grade patellofemoral arthrosis 3. Status post knee arthroscopy on the left partial meniscectomy and debridement  Plan:   At this point we discussed options. Living with his current symptomatology. He reports the knee is giving way on him and has mechanical symptoms of locking. Given the meniscal pathology it is reasonable to proceed with a knee arthroscopy partial medial lateral meniscectomies if noted. However indicated that cartilage cannot be put back on the end of the bone and the severe facet arthrosis would remain. He does not have significant arthritis in the medial lateral compartments and therefore I would not recommend a knee replacement. Given these findings I would adjust his current restrictions to sedentary. With the anticipation of returning to a medium job classification at the conclusion of his treatment for his knee. With ongoing symptoms since his initial injury I feel this is related to his initial injury. As is his patellofemoral arthrosis  I discussed the risk and benefits of knee arthroscopy including no changes in their symptoms worsening in their symptoms DVT, PE, anesthetic complications etc. Surgical possibilities include chondroplasty, microfracture, partial meniscectomy, plica excision etc. We also discussed the possible need for repeat arthroscopy in the future as well as possible continued treatment including corticosteroid injections and possible Visco supplementation. In addition we discussed the possibility of even eventually required a total knee replacement if significant arthritis encountered.  Also indicating that it is an outpatient procedure. 1-2 days on crutches. Postoperative DVT prophylaxis with aspirin  if tolerated. Follow-up in the office in 2 weeks following the surgery. Possible consideration of formal of supervised physical therapy as well.  In terms of his lumbar spine. Again he has well-maintained disc spaces. Likely symptomatic from his facet arthrosis. He reports ongoing symptoms across the lower part of his back. It is reasonable to have him undergo facet injections at L5-S1. And if only temporarily effective we discussed RFA. Avoiding twisting and loading the facet joints. Continue physical therapy in the interim.  A TENS unit would be helpful as that was helpful in physical therapy.  A prescription was given for a muscle relaxer to be taken for the reduction of muscle spasms. They are typically better to be taken at night when the patient is at rest as they do cause somnolence or grogginess. When they are taken during the day they typically will interfere with the function of muscles that are typically required for core support and functional activity. Therefore, they can significantly impair one's ability to for example operate machines such as  motor vehicles and Biomedical engineer. Again best taken not during the day.  Oxycodone  postoperatively 5 mg.. Aspirin  for DVT prophylaxis. No history of DVT or MRSA.  Plan Right knee scope, partial medial and lateral meniscectomy, debridement  Darice CHRISTELLA Randy, PA-C for Dr Duwayne 10/16/2023, 11:42 AM

## 2023-10-17 NOTE — Progress Notes (Signed)
 COVID Vaccine received:  []  No [x]  Yes Date of any COVID positive Test in last 90 days: No PCP - Valery Ripple MD Cardiologist - Sunit Tolia DO  Chest x-ray -  EKG -  09/08/23 Epic Stress Test -  ECHO - 10/16/23 Epic Cardiac Cath -   Cardiac clearance 09/08/23- Caitlyn Walker NP  Bowel Prep - [x]  No  []   Yes ______  Pacemaker / ICD device [x]  No []  Yes   Spinal Cord Stimulator:[x]  No []  Yes       History of Sleep Apnea? []  No [x]  Yes   CPAP used?- []  No [x]  Yes    Does the patient monitor blood sugar?          [x]  No []  Yes  []  N/A  Patient has: [x]  NO Hx DM   []  Pre-DM                 []  DM1  []   DM2 Does patient have a [employer] or Dexacom? [x]  No []  Yes   Fasting Blood Sugar Ranges-  Checks Blood Sugar _____ times a day  GLP1 agonist / usual dose - no GLP1 instructions:  SGLT-2 inhibitors / usual dose - no SGLT-2 instructions:   Blood Thinner / Instructions:no Aspirin  Instructions:ASA 81mg   will stop 10/25/23  Comments:   Activity level: Patient is able to climb a flight of stairs without difficulty; [x]  No CP  [x]  No SOB,  Patient can perform ADLs without assistance.   Anesthesia review:   Patient denies shortness of breath, fever, cough and chest pain at PAT appointment.  Patient verbalized understanding and agreement to the Pre-Surgical Instructions that were given to them at this PAT appointment. Patient was also educated of the need to review these PAT instructions again prior to his/her surgery.I reviewed the appropriate phone numbers to call if they have any and questions or concerns.

## 2023-10-19 ENCOUNTER — Ambulatory Visit (HOSPITAL_BASED_OUTPATIENT_CLINIC_OR_DEPARTMENT_OTHER): Payer: Self-pay | Admitting: Family

## 2023-10-19 ENCOUNTER — Encounter (HOSPITAL_COMMUNITY): Payer: Self-pay | Admitting: Specialist

## 2023-10-19 ENCOUNTER — Other Ambulatory Visit: Payer: Self-pay

## 2023-10-19 ENCOUNTER — Other Ambulatory Visit (HOSPITAL_COMMUNITY): Payer: Self-pay | Admitting: *Deleted

## 2023-10-19 ENCOUNTER — Encounter (HOSPITAL_COMMUNITY)
Admission: RE | Admit: 2023-10-19 | Discharge: 2023-10-19 | Disposition: A | Discharge status: Home / Self Care | Source: Ambulatory Visit | Attending: Specialist | Admitting: Specialist

## 2023-10-19 VITALS — BP 150/102 | HR 60 | Temp 97.9°F | Resp 20 | Wt 317.0 lb

## 2023-10-19 DIAGNOSIS — Z7982 Long term (current) use of aspirin: Secondary | ICD-10-CM | POA: Diagnosis not present

## 2023-10-19 DIAGNOSIS — Z01818 Encounter for other preprocedural examination: Secondary | ICD-10-CM

## 2023-10-19 DIAGNOSIS — I509 Heart failure, unspecified: Secondary | ICD-10-CM | POA: Diagnosis not present

## 2023-10-19 DIAGNOSIS — I110 Hypertensive heart disease with heart failure: Secondary | ICD-10-CM | POA: Insufficient documentation

## 2023-10-19 DIAGNOSIS — I10 Essential (primary) hypertension: Secondary | ICD-10-CM

## 2023-10-19 DIAGNOSIS — I428 Other cardiomyopathies: Secondary | ICD-10-CM | POA: Insufficient documentation

## 2023-10-19 DIAGNOSIS — S83281A Other tear of lateral meniscus, current injury, right knee, initial encounter: Secondary | ICD-10-CM | POA: Insufficient documentation

## 2023-10-19 DIAGNOSIS — Z01812 Encounter for preprocedural laboratory examination: Secondary | ICD-10-CM | POA: Diagnosis not present

## 2023-10-19 DIAGNOSIS — S83241A Other tear of medial meniscus, current injury, right knee, initial encounter: Secondary | ICD-10-CM | POA: Diagnosis not present

## 2023-10-19 DIAGNOSIS — I2510 Atherosclerotic heart disease of native coronary artery without angina pectoris: Secondary | ICD-10-CM | POA: Diagnosis not present

## 2023-10-19 DIAGNOSIS — X58XXXA Exposure to other specified factors, initial encounter: Secondary | ICD-10-CM | POA: Insufficient documentation

## 2023-10-19 DIAGNOSIS — Z79899 Other long term (current) drug therapy: Secondary | ICD-10-CM | POA: Diagnosis not present

## 2023-10-19 LAB — CBC
HCT: 43 % (ref 39.0–52.0)
Hemoglobin: 13.9 g/dL (ref 13.0–17.0)
MCH: 29.8 pg (ref 26.0–34.0)
MCHC: 32.3 g/dL (ref 30.0–36.0)
MCV: 92.1 fL (ref 80.0–100.0)
Platelets: 239 K/uL (ref 150–400)
RBC: 4.67 MIL/uL (ref 4.22–5.81)
RDW: 13.8 % (ref 11.5–15.5)
WBC: 5.3 K/uL (ref 4.0–10.5)
nRBC: 0 % (ref 0.0–0.2)

## 2023-10-19 LAB — BASIC METABOLIC PANEL WITH GFR
Anion gap: 9 (ref 5–15)
BUN: 16 mg/dL (ref 8–23)
CO2: 28 mmol/L (ref 22–32)
Calcium: 9.6 mg/dL (ref 8.9–10.3)
Chloride: 101 mmol/L (ref 98–111)
Creatinine, Ser: 0.97 mg/dL (ref 0.61–1.24)
GFR, Estimated: >60 mL/min (ref >60)
Glucose, Bld: 94 mg/dL (ref 70–99)
Potassium: 3.7 mmol/L (ref 3.5–5.1)
Sodium: 138 mmol/L (ref 135–145)

## 2023-10-19 NOTE — Progress Notes (Addendum)
 COVID Vaccine received:  []  No [x]  Yes Date of any COVID positive Test in last 90 days: No PCP - Valery Ripple MD Cardiologist - Sunit Tolia DO  Chest x-ray -  EKG -  09/08/23 Epic Stress Test -  ECHO - 10/16/23 Epic Cardiac Cath -   Cardiac clearance 09/08/23- Caitlyn Walker NP  Bowel Prep - [x]  No  []   Yes ______  Pacemaker / ICD device [x]  No []  Yes   Spinal Cord Stimulator:[x]  No []  Yes       History of Sleep Apnea? []  No [x]  Yes   CPAP used?- []  No [x]  Yes    Does the patient monitor blood sugar?          [x]  No []  Yes  []  N/A  Patient has: [x]  NO Hx DM   []  Pre-DM                 []  DM1  []   DM2 Does patient have a [employer] or Dex[employer]? [x]  No []  Yes   Fasting Blood Sugar Ranges-  Checks Blood Sugar _____ times a day  GLP1 agonist / usual dose - no GLP1 instructions:  SGLT-2 inhibitors / usual dose - no SGLT-2 instructions:   Blood Thinner / Instructions:no Aspirin  Instructions:ASA 81mg   will stop 10/25/23  Comments:   Activity level: Patient is able to climb a flight of stairs without difficulty; [x]  No CP  [x]  No SOB,  Patient can perform ADLs without assistance.   Anesthesia review: HTN, Cardiomyopathy, CHF, pre diabetic  Patient denies shortness of breath, fever, cough and chest pain at PAT appointment.  Patient verbalized understanding and agreement to the Pre-Surgical Instructions that were given to them at this PAT appointment. Patient was also educated of the need to review these PAT instructions again prior to his/her surgery.I reviewed the appropriate phone numbers to call if they have any and questions or concerns.

## 2023-10-19 NOTE — Patient Instructions (Addendum)
 SURGICAL WAITING ROOM VISITATION  Patients having surgery or a procedure may have no more than 2 support people in the waiting area - these visitors may rotate.    Children under the age of 75 must have an adult with them who is not the patient.  Visitors with respiratory illnesses are discouraged from visiting and should remain at home.  If the patient needs to stay at the hospital during part of their recovery, the visitor guidelines for inpatient rooms apply. Pre-op nurse will coordinate an appropriate time for 1 support person to accompany patient in pre-op.  This support person may not rotate.    Please refer to the [facility] website for the visitor guidelines for Inpatients (after your surgery is over and you are in a regular room).       Your procedure is scheduled on: 11/08/23   Report to [facility] Main Entrance    Report to admitting at 11:00  AM   Call this number if you have problems the morning of surgery [phone]   Do not eat food :After Midnight.   After Midnight you may have the following liquids until 10:00 AM DAY OF SURGERY  Water Non-Citrus Juices (without pulp, NO RED-Apple, White grape, White cranberry) Black Coffee (NO MILK/CREAM OR CREAMERS, sugar ok)  Clear Tea (NO MILK/CREAM OR CREAMERS, sugar ok) regular and decaf                             Plain Jell-O (NO RED)                                           Fruit ices (not with fruit pulp, NO RED)                                     Popsicles (NO RED)                                                               Sports drinks like Gatorade (NO RED)                   The day of surgery:  Drink ONE (1) Pre-Surgery G2 by 10:00 AM the morning of surgery. Drink in one sitting. Do not sip.  This drink was given to you during your hospital  pre-op appointment visit. Nothing else to drink after completing the Pre-SurgeryG2.         Oral Hygiene is also important to reduce your risk of  infection.                                    Remember - BRUSH YOUR TEETH THE MORNING OF SURGERY WITH YOUR REGULAR TOOTHPASTE  DENTURES WILL BE REMOVED PRIOR TO SURGERY PLEASE DO NOT APPLY Poly grip OR ADHESIVES!!!   Stop all vitamins and herbal supplements 7 days before surgery.   Take these medicines the morning of surgery with A SIP OF WATER:  Amlodipine   Atorvastatin  Metoprolol .              Do not take Hydrochlorothiazide  the morning of surgery.             You may not have any metal on your body including  jewelry, and body piercing             Do not wear  lotions, powders, cologne, or deodorant              Men may shave face and neck.   Do not bring valuables to the hospital. [facility] IS NOT  RESPONSIBLE   FOR VALUABLES.   Contacts, glasses, dentures or bridgework may not be worn into surgery.  DO NOT BRING YOUR HOME MEDICATIONS TO THE HOSPITAL. PHARMACY WILL DISPENSE MEDICATIONS LISTED ON YOUR MEDICATION LIST TO YOU DURING YOUR ADMISSION IN THE HOSPITAL!    Patients discharged on the day of surgery will not be allowed to drive home.  Someone NEEDS to stay with you for the first 24 hours after anesthesia.   Special Instructions: Bring a copy of your healthcare power of attorney and living will documents the day of surgery if you haven't scanned them before.              Please read over the following fact sheets you were given: IF YOU HAVE QUESTIONS ABOUT YOUR PRE-OP INSTRUCTIONS PLEASE CALL [phone] Gwen   If you received a COVID test during your pre-op visit  it is requested that you wear a mask when out in public, stay away from anyone that may not be feeling well and notify your surgeon if you develop symptoms. If you test positive for Covid or have been in contact with anyone that has tested positive in the last 10 days please notify you surgeon.     - Preparing for Surgery Before surgery, you can play an important role.  Because skin is not sterile,  your skin needs to be as free of germs as possible.  You can reduce the number of germs on your skin by washing with CHG (chlorahexidine gluconate) soap before surgery.  CHG is an antiseptic cleaner which kills germs and bonds with the skin to continue killing germs even after washing. Please DO NOT use if you have an allergy to CHG or antibacterial soaps.  If your skin becomes reddened/irritated stop using the CHG and inform your nurse when you arrive at Short Stay. Do not shave (including legs and underarms) for at least 48 hours prior to the first CHG shower.  You may shave your face/neck.  Please follow these instructions carefully:  1.  Shower with CHG Soap the night before surgery ONLY (DO NOT USE THE SOAP THE MORNING OF SURGERY).  2.  If you choose to wash your hair, wash your hair first as usual with your normal  shampoo.  3.  After you shampoo, rinse your hair and body thoroughly to remove the shampoo.                             4.  Use CHG as you would any other liquid soap.  You can apply chg directly to the skin and wash.  Gently with a scrungie or clean washcloth.  5.  Apply the CHG Soap to your body ONLY FROM THE NECK DOWN.   Do   not use on face/ open  Wound or open sores. Avoid contact with eyes, ears mouth and   genitals (private parts).                       Wash face,  Genitals (private parts) with your normal soap.             6.  Wash thoroughly, paying special attention to the area where your    surgery  will be performed.  7.  Thoroughly rinse your body with warm water from the neck down.  8.  DO NOT shower/wash with your normal soap after using and rinsing off the CHG Soap.                9.  Pat yourself dry with a clean towel.            10.  Wear clean pajamas.            11.  Place clean sheets on your bed the night of your first shower and do not  sleep with pets. Day of Surgery : Do not apply any CHG, lotions/deodorants the morning of  surgery.  Please wear clean clothes to the hospital/surgery center.  FAILURE TO FOLLOW THESE INSTRUCTIONS MAY RESULT IN THE CANCELLATION OF YOUR SURGERY  PATIENT SIGNATURE_________________________________  NURSE SIGNATURE__________________________________  ________________________________________________________________________

## 2023-10-19 NOTE — Telephone Encounter (Signed)
 Patient returned RN's call.

## 2023-10-19 NOTE — Patient Instructions (Signed)
 SURGICAL WAITING ROOM VISITATION  Patients having surgery or a procedure may have no more than 2 support people in the waiting area - these visitors may rotate.    Children under the age of 67 must have an adult with them who is not the patient.  Visitors with respiratory illnesses are discouraged from visiting and should remain at home.  If the patient needs to stay at the hospital during part of their recovery, the visitor guidelines for inpatient rooms apply. Pre-op nurse will coordinate an appropriate time for 1 support person to accompany patient in pre-op.  This support person may not rotate.    Please refer to the [facility] website for the visitor guidelines for Inpatients (after your surgery is over and you are in a regular room).       Your procedure is scheduled on: 11/01/23   Report to [facility] Main Entrance    Report to admitting at 5:15 AM   Call this number if you have problems the morning of surgery [phone]   Do not eat food :After Midnight.   After Midnight you may have the following liquids until  4:30 AM DAY OF SURGERY  Water Non-Citrus Juices (without pulp, NO RED-Apple, White grape, White cranberry) Black Coffee (NO MILK/CREAM OR CREAMERS, sugar ok)  Clear Tea (NO MILK/CREAM OR CREAMERS, sugar ok) regular and decaf                             Plain Jell-O (NO RED)                                           Fruit ices (not with fruit pulp, NO RED)                                     Popsicles (NO RED)                                                               Sports drinks like Gatorade (NO RED)                   The day of surgery:  Drink ONE (1) Pre-Surgery G2 at  4:30 AM the morning of surgery. Drink in one sitting. Do not sip.  This drink was given to you during your hospital  pre-op appointment visit. Nothing else to drink after completing the  Pre-Surgery  G2.     Oral Hygiene is also important to reduce your risk of  infection.                                    Remember - BRUSH YOUR TEETH THE MORNING OF SURGERY WITH YOUR REGULAR TOOTHPASTE   Stop all vitamins and herbal supplements 7 days before surgery.   Take these medicines the morning of surgery with A SIP OF WATER: Amlodipine , Atorvastatin , Metoprolol   Bring CPAP mask and tubing day of surgery.  You may not have any metal on your body including hair pins, jewelry, and body piercing             Do not wear make-up, lotions, powders, perfumes/cologne, or deodorant               Men may shave face and neck.   Do not bring valuables to the hospital. [facility] IS NOT             RESPONSIBLE   FOR VALUABLES.   Contacts, glasses, dentures or bridgework may not be worn into surgery.   Bring small overnight bag day of surgery.   DO NOT BRING YOUR HOME MEDICATIONS TO THE HOSPITAL. PHARMACY WILL DISPENSE MEDICATIONS LISTED ON YOUR MEDICATION LIST TO YOU DURING YOUR ADMISSION IN THE HOSPITAL!    Patients discharged on the day of surgery will not be allowed to drive home.  Someone NEEDS to stay with you for the first 24 hours after anesthesia.   Special Instructions: Bring a copy of your healthcare power of attorney and living will documents the day of surgery if you haven't scanned them before.              Please read over the following fact sheets you were given: IF YOU HAVE QUESTIONS ABOUT YOUR PRE-OP INSTRUCTIONS PLEASE CALL [phone] Sean Norman   If you received a COVID test during your pre-op visit  it is requested that you wear a mask when out in public, stay away from anyone that may not be feeling well and notify your surgeon if you develop symptoms. If you test positive for Covid or have been in contact with anyone that has tested positive in the last 10 days please notify you surgeon.    [facility] - Preparing for Surgery Before surgery, you can play an important role.  Because skin is not sterile, your skin  needs to be as free of germs as possible.  You can reduce the number of germs on your skin by washing with CHG (chlorahexidine gluconate) soap before surgery.  CHG is an antiseptic cleaner which kills germs and bonds with the skin to continue killing germs even after washing. Please DO NOT use if you have an allergy to CHG or antibacterial soaps.  If your skin becomes reddened/irritated stop using the CHG and inform your nurse when you arrive at Short Stay. Do not shave (including legs and underarms) for at least 48 hours prior to the first CHG shower.  You may shave your face/neck.  Please follow these instructions carefully:  1.  Shower with CHG Soap the night before surgery and the morning of surgery.  2.  If you choose to wash your hair, wash your hair first as usual with your normal  shampoo.  3.  After you shampoo, rinse your hair and body thoroughly to remove the shampoo.                             4.  Use CHG as you would any other liquid soap.  You can apply chg directly to the skin and wash.  Gently with a scrungie or clean washcloth.  5.  Apply the CHG Soap to your body ONLY FROM THE NECK DOWN.   Do   not use on face/ open  Wound or open sores. Avoid contact with eyes, ears mouth and   genitals (private parts).                       Wash face,  Genitals (private parts) with your normal soap.             6.  Wash thoroughly, paying special attention to the area where your    surgery  will be performed.  7.  Thoroughly rinse your body with warm water from the neck down.  8.  DO NOT shower/wash with your normal soap after using and rinsing off the CHG Soap.                9.  Pat yourself dry with a clean towel.            10.  Wear clean pajamas.            11.  Place clean sheets on your bed the night of your first shower and do not  sleep with pets. Day of Surgery : Do not apply any CHG, lotions/deodorants the morning of surgery.  Please wear clean clothes to  the hospital/surgery center.  FAILURE TO FOLLOW THESE INSTRUCTIONS MAY RESULT IN THE CANCELLATION OF YOUR SURGERY  PATIENT SIGNATURE_________________________________  NURSE SIGNATURE__________________________________  ________________________________________________________________________

## 2023-10-24 ENCOUNTER — Encounter (HOSPITAL_COMMUNITY): Payer: Self-pay

## 2023-10-24 NOTE — Progress Notes (Signed)
 Case: [id number] Date/Time: 11/01/23 0715   Procedure: ARTHROSCOPY, KNEE WITH DEBRIDEMENT (Right: Knee) - right knee arthroscopy, partial medial and lateral meniscectomy and debridement   Anesthesia type: Choice   Diagnosis:      Acute medial meniscus tear of right knee, initial encounter [S83.241A]     Acute lateral meniscus tear of right knee, initial encounter [S83.281A]   Pre-op diagnosis: medial and lateral meniscus tears, degenerative joint disease rt knee   Location: [facility] ROOM 08 / [facility]   Surgeons: Sean Purchase, MD       DISCUSSION: Sean Norman is a 68 yo male with PMH of HTN, CAD (by CTA), NICM/CHF EF 40-45%, arthritis.  Patient follows with Cardiology for hx of NICM and CAD. He was found to have a reduced EF in 2023 after establishing care for an abnormal EKG. He subsequently had a coronary CTA which showed nonobstructive CAD. He is on medical therapy. Last seen on 09/08/23 for pre op clearance for colonoscopy and knee scope. Noted to be stable and was cleared for surgery:  Preop clearance - pending colonoscopy and knee surgery. According to the Revised Cardiac Risk Index (RCRI), his Perioperative Risk of Major Cardiac Event is (%): 6.6. His Functional Capacity in METs is: 5.81 according to the Duke Activity Status Index (DASI). Per AHA/ACC guidelines, he is deemed acceptable risk for the planned procedure without additional cardiovascular testing. Will route to Sean team so they are aware.  May hold Aspirin  7 days prior to planned procedure.  Colonoscopy was done on 10/10/23 and was uncomplicated  Echo was updated on 10/16/23 and showed improved EF to 40-45%.    VS: BP (!) 150/102   Pulse 60   Temp 36.6 C (Oral)   Resp 20   Wt (!) 143.8 kg   SpO2 98%   BMI 39.62 kg/m   PROVIDERS: Sean Charm, MD   LABS: Labs reviewed: Acceptable for surgery. (all labs ordered are listed, but only abnormal results are displayed)  Labs Reviewed - No data to  display   IMAGES:   EKG 09/08/23:  Normal sinus rhythm Left ventricular hypertrophy Stable inferolateral TWI, improved from prior Prolonged QT  Echo 10/16/23:  IMPRESSIONS    1. Left ventricular ejection fraction, by estimation, is 40 to 45%. The left ventricle has mildly decreased function. The left ventricle demonstrates global hypokinesis. Left ventricular diastolic parameters are consistent with Grade I diastolic dysfunction (impaired relaxation).  2. Right ventricular systolic function is normal. The right ventricular size is mildly enlarged.  3. The mitral valve is normal in structure. No evidence of mitral valve regurgitation. No evidence of mitral stenosis.  4. The aortic valve is normal in structure. Aortic valve regurgitation is not visualized. No aortic stenosis is present.  5. Aortic dilatation noted. There is borderline dilatation of the ascending aorta, measuring 39 mm.  6. The inferior vena cava is normal in size with greater than 50% respiratory variability, suggesting right atrial pressure of 3 mmHg.  Comparison(s): EF 30-35%, moderate global hypokinesis.  Conclusion(s)/Recommendation(s): Compared to prior report, patient's LV systolic function has improved. Estimated LVEF = 40-45%. Known ascending aortic diameter is stable at 3.9 cm.  Coronary CTA 08/03/2021:   IMPRESSION: 1. Total coronary calcium  score of 61.6. This was 71st percentile for age and sex matched control.   2. Normal coronary origin with left dominance.   3. CAD-RADS = 1.   Left Main: Patent.   LAD: Minimal plaque within proximal LAD otherwise patent.   LCx: Patent  RCA: Patent.   RECOMMENDATIONS:   Consider non-atherosclerotic causes of chest pain. Consider preventive therapy and risk factor modification. Past Medical History:  Diagnosis Date   Allergic rhinitis 05/02/2019   Arthritis    Cardiomyopathy (HCC)    CHF (congestive heart failure) (HCC)    Chicken pox     Coronary artery calcification    Hyperlipidemia    Hypertension    Vitiligo     Past Sean History:  Procedure Laterality Date   COLONOSCOPY  2014   in [place]-polyp per pt   COLONOSCOPY N/A 10/10/2023   Procedure: COLONOSCOPY;  Surgeon: Sean Claw, MD;  Location: [facility];  Service: Gastroenterology;  Laterality: N/A;   KNEE ARTHROSCOPY WITH LATERAL MENISECTOMY Left 09/22/2021   Procedure: KNEE ARTHROSCOPY WITH MEDIAL AND LATERAL MENISECTOMY AND DEBRIDEMENT;  Surgeon: Sean Purchase, MD;  Location: [facility];  Service: Orthopedics;  Laterality: Left;   NO PAST SURGERIES     POLYPECTOMY      MEDICATIONS:  aspirin  EC 81 MG tablet   atorvastatin  (LIPITOR) 40 MG tablet   hydrochlorothiazide  (HYDRODIURIL ) 25 MG tablet   metoprolol  succinate (TOPROL -XL) 50 MG 24 hr tablet   NON FORMULARY   spironolactone  (ALDACTONE ) 25 MG tablet   No current facility-administered medications for this encounter.    Sean Norman Sean Norman Sean Norman Phone [phone] 10/24/2023 9:07 AM

## 2023-10-26 DIAGNOSIS — I10 Essential (primary) hypertension: Secondary | ICD-10-CM | POA: Diagnosis not present

## 2023-10-26 DIAGNOSIS — Z23 Encounter for immunization: Secondary | ICD-10-CM | POA: Diagnosis not present

## 2023-10-26 DIAGNOSIS — E785 Hyperlipidemia, unspecified: Secondary | ICD-10-CM | POA: Diagnosis not present

## 2023-10-26 DIAGNOSIS — I2510 Atherosclerotic heart disease of native coronary artery without angina pectoris: Secondary | ICD-10-CM | POA: Diagnosis not present

## 2023-11-01 DIAGNOSIS — Z01818 Encounter for other preprocedural examination: Secondary | ICD-10-CM

## 2023-11-01 DIAGNOSIS — I10 Essential (primary) hypertension: Secondary | ICD-10-CM

## 2023-11-01 HISTORY — DX: Unspecified osteoarthritis, unspecified site: M19.90

## 2023-11-03 NOTE — Progress Notes (Signed)
 Patient phoned to give updated information on surgery.  Surgery was rescheduled on 11/01/23 due to surgeon emergency.  Date of Surgery - 11-08-23  Arrival Time - 11:00 and check in at admitting.    NPO Status - patient reminded to not eat solid food after midnight.  From midnight until 10:00 may have clear liquids  Medications morning of surgery - Amlodipine , Atorvastatin , Metoprolol   No change in medical history, allergies per patient.  Transportation home - [facility] [phone]  All questions answered and patient stated understanding.  Copy of instructions emailed to patient.

## 2023-11-08 ENCOUNTER — Ambulatory Visit (HOSPITAL_COMMUNITY)
Admission: RE | Admit: 2023-11-08 | Discharge: 2023-11-08 | Disposition: A | Discharge status: Home / Self Care | Attending: Specialist | Admitting: Specialist

## 2023-11-08 ENCOUNTER — Ambulatory Visit (HOSPITAL_COMMUNITY): Payer: Self-pay | Admitting: Physician Assistant

## 2023-11-08 ENCOUNTER — Encounter (HOSPITAL_COMMUNITY)
Admission: RE | Disposition: A | Discharge status: Home / Self Care | Payer: Self-pay | Source: Home / Self Care | Attending: Specialist

## 2023-11-08 ENCOUNTER — Other Ambulatory Visit: Payer: Self-pay

## 2023-11-08 ENCOUNTER — Ambulatory Visit (HOSPITAL_COMMUNITY): Admitting: Anesthesiology

## 2023-11-08 ENCOUNTER — Encounter (HOSPITAL_COMMUNITY): Payer: Self-pay | Admitting: Specialist

## 2023-11-08 DIAGNOSIS — S83241A Other tear of medial meniscus, current injury, right knee, initial encounter: Secondary | ICD-10-CM | POA: Insufficient documentation

## 2023-11-08 DIAGNOSIS — Z8249 Family history of ischemic heart disease and other diseases of the circulatory system: Secondary | ICD-10-CM | POA: Diagnosis not present

## 2023-11-08 DIAGNOSIS — I2510 Atherosclerotic heart disease of native coronary artery without angina pectoris: Secondary | ICD-10-CM | POA: Insufficient documentation

## 2023-11-08 DIAGNOSIS — M1731 Unilateral post-traumatic osteoarthritis, right knee: Secondary | ICD-10-CM | POA: Diagnosis not present

## 2023-11-08 DIAGNOSIS — I110 Hypertensive heart disease with heart failure: Secondary | ICD-10-CM | POA: Diagnosis not present

## 2023-11-08 DIAGNOSIS — M94261 Chondromalacia, right knee: Secondary | ICD-10-CM | POA: Diagnosis not present

## 2023-11-08 DIAGNOSIS — I10 Essential (primary) hypertension: Secondary | ICD-10-CM

## 2023-11-08 DIAGNOSIS — I509 Heart failure, unspecified: Secondary | ICD-10-CM | POA: Diagnosis not present

## 2023-11-08 DIAGNOSIS — X58XXXA Exposure to other specified factors, initial encounter: Secondary | ICD-10-CM | POA: Insufficient documentation

## 2023-11-08 DIAGNOSIS — S83281A Other tear of lateral meniscus, current injury, right knee, initial encounter: Secondary | ICD-10-CM | POA: Insufficient documentation

## 2023-11-08 DIAGNOSIS — M1711 Unilateral primary osteoarthritis, right knee: Secondary | ICD-10-CM | POA: Diagnosis not present

## 2023-11-08 DIAGNOSIS — Z01818 Encounter for other preprocedural examination: Secondary | ICD-10-CM

## 2023-11-08 HISTORY — PX: KNEE ARTHROSCOPY W/ DEBRIDEMENT: SHX1867

## 2023-11-08 SURGERY — ARTHROSCOPY, KNEE WITH DEBRIDEMENT
Anesthesia: General | Site: Knee | Laterality: Right

## 2023-11-08 MED ORDER — PROPOFOL 10 MG/ML IV BOLUS
INTRAVENOUS | Status: AC
  Filled 2023-11-08: qty 20

## 2023-11-08 MED ORDER — PROPOFOL 10 MG/ML IV BOLUS
INTRAVENOUS | Status: DC | PRN
  Administered 2023-11-08: 300 mg via INTRAVENOUS

## 2023-11-08 MED ORDER — OXYCODONE HCL 5 MG/5ML PO SOLN: 5.0000 mg | Freq: Once | ORAL | Status: DC | PRN

## 2023-11-08 MED ORDER — BUPIVACAINE-EPINEPHRINE (PF) 0.5% -1:200000 IJ SOLN
INTRAMUSCULAR | Status: AC
  Filled 2023-11-08: qty 30

## 2023-11-08 MED ORDER — METHOCARBAMOL 500 MG PO TABS: 500.0000 mg | ORAL_TABLET | Freq: Three times a day (TID) | ORAL | 1 refills | Status: AC | PRN

## 2023-11-08 MED ORDER — EPINEPHRINE PF 1 MG/ML IJ SOLN
INTRAMUSCULAR | Status: AC
  Filled 2023-11-08: qty 2

## 2023-11-08 MED ORDER — ORAL CARE MOUTH RINSE: 15.0000 mL | Freq: Once | OROMUCOSAL | Status: AC

## 2023-11-08 MED ORDER — AMISULPRIDE (ANTIEMETIC) 5 MG/2ML IV SOLN: 10.0000 mg | Freq: Once | INTRAVENOUS | Status: DC | PRN

## 2023-11-08 MED ORDER — SUGAMMADEX SODIUM 200 MG/2ML IV SOLN
INTRAVENOUS | Status: AC
  Filled 2023-11-08: qty 4

## 2023-11-08 MED ORDER — FENTANYL CITRATE (PF) 100 MCG/2ML IJ SOLN
INTRAMUSCULAR | Status: AC
  Filled 2023-11-08: qty 2

## 2023-11-08 MED ORDER — IBUPROFEN 800 MG PO TABS: 800.0000 mg | ORAL_TABLET | Freq: Three times a day (TID) | ORAL | 1 refills | Status: AC | PRN

## 2023-11-08 MED ORDER — LACTATED RINGERS IV SOLN: INTRAVENOUS | Status: DC

## 2023-11-08 MED ORDER — DOCUSATE SODIUM 100 MG PO CAPS: 100.0000 mg | ORAL_CAPSULE | Freq: Two times a day (BID) | ORAL | 2 refills | Status: AC

## 2023-11-08 MED ORDER — LACTATED RINGERS IV SOLN
INTRAVENOUS | Status: DC
Start: 2023-11-08 — End: 2023-11-08

## 2023-11-08 MED ORDER — OXYCODONE HCL 5 MG PO TABS: 5.0000 mg | ORAL_TABLET | Freq: Once | ORAL | Status: DC | PRN

## 2023-11-08 MED ORDER — CEFAZOLIN SODIUM-DEXTROSE 3-4 GM/150ML-% IV SOLN
3.0000 g | INTRAVENOUS | Status: AC
  Administered 2023-11-08: 3 g via INTRAVENOUS
  Filled 2023-11-08: qty 150

## 2023-11-08 MED ORDER — DEXAMETHASONE SOD PHOSPHATE PF 10 MG/ML IJ SOLN
INTRAMUSCULAR | Status: DC | PRN
  Administered 2023-11-08: 10 mg via INTRAVENOUS

## 2023-11-08 MED ORDER — ROCURONIUM BROMIDE 10 MG/ML (PF) SYRINGE
PREFILLED_SYRINGE | INTRAVENOUS | Status: AC
  Filled 2023-11-08: qty 10

## 2023-11-08 MED ORDER — LIDOCAINE HCL (PF) 2 % IJ SOLN
INTRAMUSCULAR | Status: AC
  Filled 2023-11-08: qty 5

## 2023-11-08 MED ORDER — FENTANYL CITRATE (PF) 100 MCG/2ML IJ SOLN
INTRAMUSCULAR | Status: DC | PRN
  Administered 2023-11-08 (×4): 25 ug via INTRAVENOUS

## 2023-11-08 MED ORDER — EPINEPHRINE PF 1 MG/ML IJ SOLN
INTRAMUSCULAR | Status: DC | PRN
  Administered 2023-11-08 (×2): 1 mL

## 2023-11-08 MED ORDER — SODIUM CHLORIDE 0.9 % IR SOLN
Status: DC | PRN
  Administered 2023-11-08: 6000 mL

## 2023-11-08 MED ORDER — CHLORHEXIDINE GLUCONATE 0.12 % MT SOLN
15.0000 mL | Freq: Once | OROMUCOSAL | Status: AC
  Administered 2023-11-08: 15 mL via OROMUCOSAL

## 2023-11-08 MED ORDER — ACETAMINOPHEN 500 MG PO TABS
1000.0000 mg | ORAL_TABLET | Freq: Once | ORAL | Status: AC
  Administered 2023-11-08: 1000 mg via ORAL
  Filled 2023-11-08: qty 2

## 2023-11-08 MED ORDER — BUPIVACAINE-EPINEPHRINE 0.5% -1:200000 IJ SOLN
INTRAMUSCULAR | Status: DC | PRN
  Administered 2023-11-08: 20 mL

## 2023-11-08 MED ORDER — ONDANSETRON HCL 4 MG/2ML IJ SOLN
INTRAMUSCULAR | Status: AC
  Filled 2023-11-08: qty 2

## 2023-11-08 MED ORDER — EPHEDRINE 5 MG/ML INJ
INTRAVENOUS | Status: AC
  Filled 2023-11-08: qty 5

## 2023-11-08 MED ORDER — LIDOCAINE HCL (CARDIAC) PF 100 MG/5ML IV SOSY
PREFILLED_SYRINGE | INTRAVENOUS | Status: DC | PRN
  Administered 2023-11-08: 80 mg via INTRAVENOUS

## 2023-11-08 MED ORDER — FENTANYL CITRATE (PF) 50 MCG/ML IJ SOSY: 25.0000 ug | PREFILLED_SYRINGE | INTRAMUSCULAR | Status: DC | PRN

## 2023-11-08 MED ORDER — ONDANSETRON HCL 4 MG/2ML IJ SOLN
INTRAMUSCULAR | Status: DC | PRN
  Administered 2023-11-08: 4 mg via INTRAVENOUS

## 2023-11-08 MED ORDER — EPHEDRINE SULFATE (PRESSORS) 25 MG/5ML IV SOSY
PREFILLED_SYRINGE | INTRAVENOUS | Status: DC | PRN
  Administered 2023-11-08: 5 mg via INTRAVENOUS

## 2023-11-08 MED ORDER — OXYCODONE HCL 10 MG PO TABS: 10.0000 mg | ORAL_TABLET | ORAL | 0 refills | Status: AC | PRN

## 2023-11-08 SURGICAL SUPPLY — 24 items
BAG COUNTER SPONGE SURGICOUNT (BAG) ×1 IMPLANT
BLADE SHAVER TORPEDO 4X13 (MISCELLANEOUS) IMPLANT
BNDG ELASTIC 6INX 5YD STR LF (GAUZE/BANDAGES/DRESSINGS) ×1 IMPLANT
BOOTIES KNEE HIGH SLOAN (MISCELLANEOUS) ×2 IMPLANT
COVER SURGICAL LIGHT HANDLE (MISCELLANEOUS) ×1 IMPLANT
DISSECTOR 3.5MM X 13CM CVD (MISCELLANEOUS) IMPLANT
DURAPREP 26ML APPLICATOR (WOUND CARE) ×1 IMPLANT
DW OUTFLOW CASSETTE/TUBE SET (MISCELLANEOUS) ×1 IMPLANT
GAUZE PAD ABD 8X10 STRL (GAUZE/BANDAGES/DRESSINGS) ×1 IMPLANT
GAUZE SPONGE 4X4 12PLY STRL (GAUZE/BANDAGES/DRESSINGS) ×1 IMPLANT
GLOVE BIOGEL PI IND STRL 7.0 (GLOVE) ×1 IMPLANT
GLOVE SURG SS PI 8.0 STRL IVOR (GLOVE) ×1 IMPLANT
GOWN STRL REUS W/ TWL XL LVL3 (GOWN DISPOSABLE) ×2 IMPLANT
KIT TURNOVER KIT A (KITS) ×1 IMPLANT
MANIFOLD NEPTUNE II (INSTRUMENTS) ×1 IMPLANT
PACK ARTHROSCOPY WL (CUSTOM PROCEDURE TRAY) ×1 IMPLANT
PADDING CAST COTTON 6X4 STRL (CAST SUPPLIES) ×1 IMPLANT
PENCIL SMOKE EVACUATOR (MISCELLANEOUS) IMPLANT
SUT ETHILON 4 0 PS 2 18 (SUTURE) ×1 IMPLANT
TOWEL OR 17X26 10 PK STRL BLUE (TOWEL DISPOSABLE) ×1 IMPLANT
TUBING ARTHROSCOPY IRRIG 16FT (MISCELLANEOUS) ×1 IMPLANT
WAND APOLLORF SJ50 AR-9845 (SURGICAL WAND) IMPLANT
WIPE CHG 2% PREP (PERSONAL CARE ITEMS) ×1 IMPLANT
WRAP KNEE MAXI GEL POST OP (GAUZE/BANDAGES/DRESSINGS) ×1 IMPLANT

## 2023-11-08 NOTE — Discharge Instructions (Signed)
Keep dressing clean and dry at all times. After 2 days, remove dressing and place bandaids over incisions. Do not kneel or squat. Ice and elevate, toes above the nose, 20-30 min at a time, 4-5 times per day to reduce swelling. Take aspirin 81mg once daily to prevent blood clots. Follow up in 10-14 days for suture removal. 

## 2023-11-08 NOTE — Anesthesia Procedure Notes (Signed)
 Procedure Name: LMA Insertion Date/Time: 11/08/2023 2:41 PM  Performed by: Therisa Doyal CROME, CRNAPatient Re-evaluated:Patient Re-evaluated prior to induction Oxygen Delivery Method: Circle system utilized Preoxygenation: Pre-oxygenation with 100% oxygen Induction Type: IV induction LMA: LMA inserted LMA Size: 5.0 Number of attempts: 1 Placement Confirmation: positive ETCO2 and breath sounds checked- equal and bilateral Tube secured with: Tape Dental Injury: Teeth and Oropharynx as per pre-operative assessment

## 2023-11-08 NOTE — Brief Op Note (Signed)
 11/08/2023  1:17 PM  PATIENT:  Sean Norman  68 y.o. male  PRE-OPERATIVE DIAGNOSIS:  medial and lateral meniscus tears, degenerative joint disease rt knee  POST-OPERATIVE DIAGNOSIS:  * No post-op diagnosis entered *  PROCEDURE:  Procedure(s) with comments: ARTHROSCOPY, KNEE WITH DEBRIDEMENT (Right) - right knee arthroscopy, partial medial and lateral meniscectomy and debridement  SURGEON:  Surgeons and Role:    DEWAINE Duwayne Purchase, MD - Primary  PHYSICIAN ASSISTANT:   ASSISTANTS: Bissell   ANESTHESIA:   general  EBL:  min   BLOOD ADMINISTERED:none  DRAINS: none   LOCAL MEDICATIONS USED:  MARCAINE      SPECIMEN:  No Specimen  DISPOSITION OF SPECIMEN:  N/A  COUNTS:  YES  TOURNIQUET:  * No tourniquets in log *  DICTATION: .Other Dictation: Dictation Number [id number]  PLAN OF CARE: Discharge to home after PACU  PATIENT DISPOSITION:  PACU - hemodynamically stable.   Delay start of Pharmacological VTE agent (>24hrs) due to surgical blood loss or risk of bleeding: no

## 2023-11-08 NOTE — Transfer of Care (Signed)
 Immediate Anesthesia Transfer of Care Note  Patient: Sean Norman  Procedure(s) Performed: ARTHROSCOPY, KNEE WITH DEBRIDEMENT (Right: Knee)  Patient Location: PACU  Anesthesia Type:General  Level of Consciousness: drowsy  Airway & Oxygen Therapy: Patient Spontanous Breathing and Patient connected to face mask oxygen  Post-op Assessment: Report given to RN and Post -op Vital signs reviewed and stable  Post vital signs: Reviewed and stable  Last Vitals:  Vitals Value Taken Time  BP 149/94 11/08/23 15:34  Temp    Pulse 56 11/08/23 15:36  Resp 14 11/08/23 15:36  SpO2 96 % 11/08/23 15:36  Vitals shown include unfiled device data.  Last Pain:  Vitals:   11/08/23 1201  TempSrc: Oral  PainSc: 0-No pain      Patients Stated Pain Goal: 3 (11/08/23 1201)  Complications: No notable events documented.

## 2023-11-08 NOTE — Anesthesia Preprocedure Evaluation (Addendum)
 Anesthesia Evaluation  Patient identified by MRN, date of birth, ID band Patient awake    Reviewed: Allergy & Precautions, NPO status , Patient's Chart, lab work & pertinent test results  Airway Mallampati: III  TM Distance: >3 FB Neck ROM: Full    Dental  (+) Dental Advisory Given   Pulmonary neg pulmonary ROS   breath sounds clear to auscultation       Cardiovascular hypertension, Pt. on medications and Pt. on home beta blockers + CAD and +CHF   Rhythm:Regular Rate:Normal     Neuro/Psych negative neurological ROS     GI/Hepatic negative GI ROS, Neg liver ROS,,,  Endo/Other  negative endocrine ROS    Renal/GU negative Renal ROS     Musculoskeletal   Abdominal   Peds  Hematology negative hematology ROS (+)   Anesthesia Other Findings   Reproductive/Obstetrics                              Anesthesia Physical Anesthesia Plan  ASA: 3  Anesthesia Plan: General   Post-op Pain Management: Tylenol  PO (pre-op)*   Induction: Intravenous  PONV Risk Score and Plan: 2 and Dexamethasone , Ondansetron  and Treatment may vary due to age or medical condition  Airway Management Planned: LMA  Additional Equipment:   Intra-op Plan:   Post-operative Plan: Extubation in OR  Informed Consent: I have reviewed the patients History and Physical, chart, labs and discussed the procedure including the risks, benefits and alternatives for the proposed anesthesia with the patient or authorized representative who has indicated his/her understanding and acceptance.     Dental advisory given  Plan Discussed with:   Anesthesia Plan Comments:          Anesthesia Quick Evaluation

## 2023-11-08 NOTE — Interval H&P Note (Signed)
 History and Physical Interval Note:  11/08/2023 1:17 PM  Sean Norman  has presented today for surgery, with the diagnosis of medial and lateral meniscus tears, degenerative joint disease rt knee.  The various methods of treatment have been discussed with the patient and family. After consideration of risks, benefits and other options for treatment, the patient has consented to  Procedure(s) with comments: ARTHROSCOPY, KNEE WITH DEBRIDEMENT (Right) - right knee arthroscopy, partial medial and lateral meniscectomy and debridement as a surgical intervention.  The patient's history has been reviewed, patient examined, no change in status, stable for surgery.  I have reviewed the patient's chart and labs.  Questions were answered to the patient's satisfaction.     Reyes JAYSON Billing

## 2023-11-09 ENCOUNTER — Encounter (HOSPITAL_COMMUNITY): Payer: Self-pay | Admitting: Specialist

## 2023-11-09 NOTE — Op Note (Unsigned)
 Sean Norman, NIMS MEDICAL RECORD NO: [id number] ACCOUNT NO: [id number] DATE OF BIRTH: [birth year] FACILITY: [facility] LOCATION: [facility] PHYSICIAN: Reyes KYM Billing, MD  Operative Report   DATE OF PROCEDURE: 11/08/2023  SURGEON:  Reyes KYM Billing, MD.  PREOPERATIVE DIAGNOSES:  Posttraumatic arthritis, right knee; medial and lateral meniscus tear, right knee.  POSTOPERATIVE DIAGNOSES:  Posttraumatic arthritis, right knee; medial and lateral meniscus tear, right knee.  PROCEDURE PERFORMED:  Right knee arthroscopy with partial medial and lateral meniscectomy, debridement and chondroplasty of the patella, femoral sulcus, medial femoral condyle, medial tibial plateau, lateral femoral condyle, and lateral tibial plateau.  ANESTHESIA:  General.  ASSISTANT:  Jaclyn Bissell, PA.  HISTORY:  A 68 year old male status post injury to the right knee, posttraumatic arthritis lateral compartment, medial and lateral meniscus tears, failing conservative treatment, indicated for arthroscopy, partial medial and lateral meniscectomy, and  debridement.  Risks and benefits discussed including bleeding, infection, damage to neurovascular structures, no change in symptoms or worsening symptoms, DVT, PE, anesthetic complications, etc.  TECHNIQUE:  The patient was in supine position after induction of adequate spinal anesthesia with 3 g of Kefzol , the right lower extremity was prepped and draped in the usual sterile fashion.  A lateral parapatellar portal was fashioned with a #11 blade.   The lateral joint was depressed from previous injury and a small joint space was noted.  I introduced an arthroscopic camera. Irrigant was utilized to insufflated joint at 35 mmHg. Under direct visualization, medial parapatellar portal was fashioned  with a #11 blade after localization with an 18-gauge needle sparing the medial meniscus.  Noted was a tearing along the posterior third of the medial meniscus.  Approximately 20% of the  posterior third was involved and introduced the shaver and debrided  it to a stable base. Grade III changes of the femoral condyle and tibial plateau were noted on weightbearing surface, light chondroplasty performed on both.  No grade IV changes.  ACL was unremarkable.  Lateral compartment revealed a depressed lateral tibial plateau posteriorly in the posterior third. There was grade III chondromalacia noted. There was extensive tearing of the inner posterior two-thirds of the medial meniscus.  I introduced the shaver  and debrided it to a stable base.  Approximately 20% of that region was involved, further resected and contoured with an ArthroWand. The anterior and posterior roots were stable to probe palpation as they were medially. Light chondroplasty was performed  on the femoral condyle and tibial plateau with extensive grade III changes. The remnant was stable to probe probation.  Suprapatellar pouch revealed extensive grade III changes, near grade IV changes of the patella, femoral sulcus.  Light chondroplasty performed on both.  There was normal patellofemoral tracking.  I revisited all compartments.  No further pathology amenable to arthroscopic intervention.  I therefore removed all instrumentation.  Portals were closed with 4-0 nylon simple sutures. 0.25% Marcaine  with epinephrine  was infiltrated in the joint.  The  wound was dressed sterilely, awoken without difficulty and transported to the recovery room in satisfactory condition.  The patient tolerated the procedure well with no complications.  Assistant, Jaclyn Bissell, PA was used throughout the case for patient positioning, holding the leg in the figure-of-four position to gain access to a tight lateral compartment and due to the patient's elevated BMI of 144 kg.  BLOOD LOSS:  Minimal blood loss.   NIK D: 11/08/2023 3:22:44 pm T: 11/09/2023 2:18:00 am  JOB: [id number]/ [id number]

## 2023-11-10 NOTE — Anesthesia Postprocedure Evaluation (Signed)
 Anesthesia Post Note  Patient: Sean Norman  Procedure(s) Performed: ARTHROSCOPY, KNEE WITH DEBRIDEMENT (Right: Knee)     Patient location during evaluation: PACU Anesthesia Type: General Level of consciousness: awake and alert Pain management: pain level controlled Vital Signs Assessment: post-procedure vital signs reviewed and stable Respiratory status: spontaneous breathing, nonlabored ventilation, respiratory function stable and patient connected to nasal cannula oxygen Cardiovascular status: blood pressure returned to baseline and stable Postop Assessment: no apparent nausea or vomiting Anesthetic complications: no   No notable events documented.  Last Vitals:  Vitals:   11/08/23 1615 11/08/23 1640  BP: 138/89   Pulse: 71   Resp: 12 17  Temp: 36.7 C   SpO2: 93% 97%    Last Pain:  Vitals:   11/08/23 1640  TempSrc:   PainSc: 0-No pain                 Epifanio Lamar BRAVO

## 2023-11-25 NOTE — Progress Notes (Unsigned)
 Cardiology Office Note:    Date:  11/27/2023   ID:  [name], DOB 12/24/1955, MRN [id number]  PCP:  Elliot Charm, MD   [facility] Providers Cardiologist:  Madonna Large, DO Cardiology APP:  Vannie Reche RAMAN, NP     Referring MD: Elliot Charm,*   Chief complaint: Follow-up of heart failure     History of Present Illness:   Sean Norman is a 68 y.o. male with a hx of NICM, CHF, HTN, nonobstructive CAD, vitiligo, obesity presenting to the office today for follow-up of chronic cardiovascular conditions.  Echo 04/2021 LVEF 30-35% with mild dilation of LV, G1DD, aneurysmal interatrial septum, mildly dilated ascending aorta 3.9 cm. Myoview 06/2021 LVEF 25% with no ischemia. Coronary CTA 08/03/21 calcium  score of 61.6 in LAD placing him in 71st percentile for age and sex matched control. Echo 01/2022 with LVEF 30-35%, G1DD, no significant valvular abnormality.   Office visit with Dr. Waddell 04/20/22 led to recommendation for ICD - he was to call the office if he wished to proceed.   Most recently evaluated by Reche Vannie on 09/08/2023 for preoperative clearance for right knee arthroplasty and colonoscopy.  At that time, patient was remaining active, overall appear to be doing well from a cardiovascular standpoint.  Echo was ordered to reevaluate HFrEF/NICM. Right knee arthroscopy with debridement performed on 11/08/2023.  Echo on 10/16/2023: LVEF 40-45%, LV with mildly decreased function, global hypokinesis, G1 DD, RV mildly enlarged, normal mitral valve, normal aortic valve, borderline dilatation of ascending aorta measuring 39 mm, RA pressure 3 mmHg.  Presents independently, he denies chest pain, palpitations, dyspnea, orthopnea, n, v,  dark/tarry/bloody stools, hematuria, dizziness, syncope, edema, weight gain. Weights average 315-320 lbs at home.  Doing well since his knee surgery.  Will be starting physical therapy this week following surgery, is applying for  a membership at the [facility] currently to become more physically active.  Monitors his BPs at home, averaging 110s-130s normally.  States he is unsure why it is elevated today.  States his PCP prescribed him amlodipine  over the summer, states he never took it.  Has a CPAP at home, does not routinely use it.   ROS:   Please see the history of present illness.    All other systems reviewed and are negative.     Past Medical History:  Diagnosis Date   Allergic rhinitis 05/02/2019   Arthritis    Cardiomyopathy (HCC)    CHF (congestive heart failure) (HCC)    Chicken pox    Coronary artery calcification    Hyperlipidemia    Hypertension    Vitiligo     Past Surgical History:  Procedure Laterality Date   COLONOSCOPY  2014   in [place]-polyp per pt   COLONOSCOPY N/A 10/10/2023   Procedure: COLONOSCOPY;  Surgeon: Elicia Claw, MD;  Location: [facility];  Service: Gastroenterology;  Laterality: N/A;   KNEE ARTHROSCOPY W/ DEBRIDEMENT Right 11/08/2023   Procedure: ARTHROSCOPY, KNEE WITH DEBRIDEMENT;  Surgeon: Duwayne Purchase, MD;  Location: [facility];  Service: Orthopedics;  Laterality: Right;  right knee arthroscopy, partial medial and lateral meniscectomy and debridement   KNEE ARTHROSCOPY WITH LATERAL MENISECTOMY Left 09/22/2021   Procedure: KNEE ARTHROSCOPY WITH MEDIAL AND LATERAL MENISECTOMY AND DEBRIDEMENT;  Surgeon: Duwayne Purchase, MD;  Location: [facility];  Service: Orthopedics;  Laterality: Left;   POLYPECTOMY      Current Medications: Current Meds  Medication Sig   aspirin  EC 81 MG tablet Take 1 tablet (81 mg total) by  mouth daily. Swallow whole.   atorvastatin  (LIPITOR) 40 MG tablet Take 1 tablet (40 mg total) by mouth at bedtime.   docusate sodium  (COLACE) 100 MG capsule Take 1 capsule (100 mg total) by mouth 2 (two) times daily.   hydrochlorothiazide  (HYDRODIURIL ) 25 MG tablet Take 25 mg by mouth every morning.   ibuprofen  (ADVIL ) 800 MG tablet Take 1 tablet (800 mg  total) by mouth 3 (three) times daily with meals as needed.   methocarbamol (ROBAXIN) 500 MG tablet Take 1 tablet (500 mg total) by mouth every 8 (eight) hours as needed for muscle spasms.   metoprolol  succinate (TOPROL -XL) 50 MG 24 hr tablet TAKE 1 TABLET(50 MG) BY MOUTH EVERY MORNING   NON FORMULARY Pt uses a cpap nightly   sacubitril-valsartan (ENTRESTO ) 24-26 MG Take 1 tablet by mouth 2 (two) times daily.   sacubitril-valsartan (ENTRESTO ) 24-26 MG Take 1 tablet by mouth 2 (two) times daily.   spironolactone  (ALDACTONE ) 25 MG tablet TAKE 1 TABLET(25 MG) BY MOUTH EVERY MORNING     Allergies:   Patient has no known allergies.   Social History   Socioeconomic History   Marital status: Married    Spouse name: Not on file   Number of children: 5   Years of education: Not on file   Highest education level: Not on file  Occupational History   Not on file  Tobacco Use   Smoking status: Never    Passive exposure: Past   Smokeless tobacco: Never  Vaping Use   Vaping status: Never Used  Substance and Sexual Activity   Alcohol use: No   Drug use: No   Sexual activity: Not on file  Other Topics Concern   Not on file  Social History Narrative   Not on file   Social Drivers of Health   Financial Resource Strain: Not on file  Food Insecurity: No Food Insecurity (11/27/2023)   Hunger Vital Sign    Worried About Running Out of Food in the Last Year: Never true    Ran Out of Food in the Last Year: Never true  Transportation Needs: Not on file  Physical Activity: Not on file  Stress: Not on file  Social Connections: Unknown (05/25/2021)   Received from [employer]   Social Network    Social Network: Not on file     Family History: The patient's family history includes Breast cancer in his maternal grandmother; Diabetes in his mother and paternal grandfather; Heart disease in his father, maternal grandfather, maternal grandmother, and mother; Hypertension in his father, maternal  grandfather, maternal grandmother, and mother; Kidney disease in his mother; Stroke in his father and maternal grandfather. There is no history of Colon cancer, Colon polyps, Esophageal cancer, Rectal cancer, or Stomach cancer.  EKGs/Labs/Other Studies Reviewed:    The following studies were reviewed today:       Recent Labs: 10/19/2023: BUN 16; Creatinine, Ser 0.97; Hemoglobin 13.9; Platelets 239; Potassium 3.7; Sodium 138  Recent Lipid Panel    Component Value Date/Time   CHOL 122 08/19/2021 1158   TRIG 49 08/19/2021 1158   HDL 46 08/19/2021 1158   CHOLHDL 4 06/20/2019 0828   VLDL 12.6 06/20/2019 0828   LDLCALC 65 08/19/2021 1158   LDLCALC 139 (H) 12/01/2017 1401   LDLDIRECT 71 08/19/2021 1158     Risk Assessment/Calculations:      HYPERTENSION CONTROL Vitals:   11/27/23 0917 11/27/23 1000  BP: (!) 150/90 (!) 154/86    The patient's blood  pressure is elevated above target today.  In order to address the patient's elevated BP: A new medication was prescribed today.            Physical Exam:    VS:  BP (!) 154/86 (BP Location: Left Arm)   Pulse 77   Ht 6' 3 (1.905 m)   Wt (!) 320 lb 14.4 oz (145.6 kg)   SpO2 93%   BMI 40.11 kg/m        Wt Readings from Last 3 Encounters:  11/27/23 (!) 320 lb 14.4 oz (145.6 kg)  11/08/23 (!) 317 lb (143.8 kg)  10/19/23 (!) 317 lb (143.8 kg)     GEN: Well nourished, well developed in no acute distress HEENT: Normal NECK: No carotid bruits CARDIAC:  S1-S2 normal, RRR, no murmurs, rubs, gallops RESPIRATORY:  Clear to auscultation without rales, wheezing or rhonchi  MUSCULOSKELETAL:  No edema; No deformity, palpable DP/PT pulses bilaterally SKIN: Warm and dry NEUROLOGIC:  Alert and oriented x 3 PSYCHIATRIC:  Normal affect       Assessment & Plan Nonischemic cardiomyopathy (HCC) HFrEF (heart failure with reduced ejection fraction) (HCC) Echo on 10/16/2023: LVEF 40-45%, LV with mildly decreased function, global  hypokinesis, G1 DD, RV mildly enlarged Discussed these results with patient Denies chest pain, shortness of breath, orthopnea, leg swelling Monitors his weights at home, states he had lost a few pounds recently, appears euvolemic on exam Considering improvement in LVEF, will focus on of uptitrating GDMT Previously on Entresto  and Farxiga , unclear when or why these were stopped On discussion with the patient, he states he did not like the side effects of Farxiga , could not remember exactly which side effects caused him to stop med. He was okay with restarting Entresto  today. Continue Toprol  XL 50 mg daily Continue spironolactone  25 mg daily  Start Entresto  24-26 mg twice daily - 2 week samples provided in office today with coupon, will forward to Prior Auth team for further prescription assistance BMP 10/19/2023: NA 138, K 3.7, BUN 16, creatinine 0.97    Will order BMP to be performed 1 week from today to reevaluate kidney function following meds start Coronary artery disease involving native coronary artery of native heart, unspecified whether angina present Coronary CTA 08/03/21: calcium  score of 61.6 in LAD placing him in 71st percentile for age and sex matched control. Denies chest pain, SOB, palpitations, near-syncope Continue Toprol -XL 50 mg daily Continue aspirin  EC 81 mg daily Continue atorvastatin  40 mg daily Continue to monitor for new S/S of ischemia Primary hypertension BPs averaging 110s-130s at home PCP prescribed amlodipine  over the summer, patient states he never took it Reports he is taking the medications listed on our med list routinely Continue hydrochlorothiazide  25 mg daily Continue Toprol -XL 50 mg daily Continue spironolactone  25 mg daily Start Entresto  24-26 mg twice daily Keep BP log and return in 1 month for further med titration/monitoring Hyperlipidemia with target LDL less than 70 No recent lipid panel on file Plan to update fasting lipids with blood draw  next week Continue atorvastatin  40 mg daily Mild ascending aorta dilatation Echo 10/16/2023: Borderline dilatation of ascending aorta measuring 39 mm Will require annual monitoring OSA (obstructive sleep apnea) Noncompliant with CPAP, discussed effects of untreated OSA with cardiac disease Patient states he understood and will try to use it more frequently  Follow-up in 1 month with MD or app for HF GDMT/HTN med titration          Medication Adjustments/Labs and Tests Ordered:  Current medicines are reviewed at length with the patient today.  Concerns regarding medicines are outlined above.  Orders Placed This Encounter  Procedures   Basic metabolic panel   Lipid panel   Meds ordered this encounter  Medications   sacubitril-valsartan (ENTRESTO ) 24-26 MG    Sig: Take 1 tablet by mouth 2 (two) times daily.    Dispense:  60 tablet    Refill:  2    Supervising Provider:   PATWARDHAN, MANISH J [[id number]]   sacubitril-valsartan (ENTRESTO ) 24-26 MG    Sig: Take 1 tablet by mouth 2 (two) times daily.    Dispense:  28 tablet    Supervising Provider:   CHRISTOPHER, BRIDGETTE [[id number]]    Lot Number?:   [id number]    Expiration Date?:   02/09/2025    Patient Instructions  Medication Instructions:  START Entresto  one 24-26mg  tablet TWICE per day  *If you need a refill on your cardiac medications before your next appointment, please call your pharmacy*  Lab Work: Your physician recommends that you return for lab work in 1 week for BMET and fasting lipids   Please have this collected at [facility] at [place]. The lab is open 8:00 am - 4:30 pm. Please avoid 12:00p - 1:00p for lunch hour. You do not need an appointment. Please go to [address] [place], [place] [address]. This is in the Primary Care office on the 3rd floor, let them know you are there for blood work and they will direct you to the lab.   If you have labs (blood work) drawn today and your tests  are completely normal, you will receive your results only by: MyChart Message (if you have MyChart) OR A paper copy in the mail If you have any lab test that is abnormal or we need to change your treatment, we will call you to review the results.  Follow-Up: At [facility], you and your health needs are our priority.  As part of our continuing mission to provide you with exceptional heart care, our providers are all part of one team.  This team includes your primary Cardiologist (physician) and Advanced Practice Providers or APPs (Physician Assistants and Nurse Practitioners) who all work together to provide you with the care you need, when you need it.  Your next appointment:   1 month(s)  Provider:   Dr. Michele or Reche Finder, NP    We recommend signing up for the patient portal called MyChart.  Sign up information is provided on this After Visit Summary.  MyChart is used to connect with patients for Virtual Visits (Telemedicine).  Patients are able to view lab/test results, encounter notes, upcoming appointments, etc.  Non-urgent messages can be sent to your provider as well.   To learn more about what you can do with MyChart, go to [email or web address].   Other Instructions  Tips to Measure your Blood Pressure Correctly Here's what you can do to ensure a correct reading:  Don't drink a caffeinated beverage or smoke during the 30 minutes before the test.  Sit quietly for five minutes before the test begins.  During the measurement, sit in a chair with your feet on the floor and your arm supported so your elbow is at about heart level.  The inflatable part of the cuff should completely cover at least 80% of your upper arm, and the cuff should be placed on bare skin, not over a shirt.  Don't talk during the measurement.  Have your blood pressure measured twice, with a brief break in between. If the readings are different by 5 points or more, have it done a third  time.  Blood pressure categories  Blood pressure category SYSTOLIC (upper number)  DIASTOLIC (lower number)  Normal Less than 120 mm Hg and Less than 80 mm Hg  Elevated 120-129 mm Hg and Less than 80 mm Hg  High blood pressure: Stage 1 hypertension 130-139 mm Hg or 80-89 mm Hg  High blood pressure: Stage 2 hypertension 140 mm Hg or higher or 90 mm Hg or higher  Hypertensive crisis (consult your doctor immediately) Higher than 180 mm Hg and/or Higher than 120 mm Hg  Source: American Heart Association and American Stroke Association. For more on getting your blood pressure under control, buy Controlling Your Blood Pressure, a Special Health Report from [facility].   Blood Pressure Log   Date   Time  Blood Pressure  Position  Example: Nov 1 9 AM 124/78 sitting                                                                Signed, Miriam FORBES Shams, NP  11/27/2023 10:16 AM    [facility]

## 2023-11-27 ENCOUNTER — Encounter (HOSPITAL_BASED_OUTPATIENT_CLINIC_OR_DEPARTMENT_OTHER): Payer: Self-pay | Admitting: Family

## 2023-11-27 ENCOUNTER — Ambulatory Visit (HOSPITAL_BASED_OUTPATIENT_CLINIC_OR_DEPARTMENT_OTHER): Admitting: Emergency Medicine

## 2023-11-27 VITALS — BP 154/86 | HR 77 | Ht 75.0 in | Wt 320.9 lb

## 2023-11-27 DIAGNOSIS — G4733 Obstructive sleep apnea (adult) (pediatric): Secondary | ICD-10-CM

## 2023-11-27 DIAGNOSIS — I5020 Unspecified systolic (congestive) heart failure: Secondary | ICD-10-CM

## 2023-11-27 DIAGNOSIS — I2510 Atherosclerotic heart disease of native coronary artery without angina pectoris: Secondary | ICD-10-CM

## 2023-11-27 DIAGNOSIS — I428 Other cardiomyopathies: Secondary | ICD-10-CM | POA: Diagnosis not present

## 2023-11-27 DIAGNOSIS — I10 Essential (primary) hypertension: Secondary | ICD-10-CM

## 2023-11-27 DIAGNOSIS — I77810 Thoracic aortic ectasia: Secondary | ICD-10-CM

## 2023-11-27 DIAGNOSIS — E785 Hyperlipidemia, unspecified: Secondary | ICD-10-CM

## 2023-11-27 MED ORDER — SACUBITRIL-VALSARTAN 24-26 MG PO TABS: 1.0000 | ORAL_TABLET | Freq: Two times a day (BID) | ORAL | 2 refills | Status: DC

## 2023-11-27 MED ORDER — SACUBITRIL-VALSARTAN 24-26 MG PO TABS: 1.0000 | ORAL_TABLET | Freq: Two times a day (BID) | ORAL | Status: DC

## 2023-11-27 NOTE — Patient Instructions (Signed)
 Medication Instructions:  START Entresto  one 24-26mg  tablet TWICE per day  *If you need a refill on your cardiac medications before your next appointment, please call your pharmacy*  Lab Work: Your physician recommends that you return for lab work in 1 week for BMET and fasting lipids   Please have this collected at [facility] at [place]. The lab is open 8:00 am - 4:30 pm. Please avoid 12:00p - 1:00p for lunch hour. You do not need an appointment. Please go to [address] [place], [place] [address]. This is in the Primary Care office on the 3rd floor, let them know you are there for blood work and they will direct you to the lab.   If you have labs (blood work) drawn today and your tests are completely normal, you will receive your results only by: MyChart Message (if you have MyChart) OR A paper copy in the mail If you have any lab test that is abnormal or we need to change your treatment, we will call you to review the results.  Follow-Up: At [facility], you and your health needs are our priority.  As part of our continuing mission to provide you with exceptional heart care, our providers are all part of one team.  This team includes your primary Cardiologist (physician) and Advanced Practice Providers or APPs (Physician Assistants and Nurse Practitioners) who all work together to provide you with the care you need, when you need it.  Your next appointment:   1 month(s)  Provider:   Dr. Michele or Reche Finder, NP    We recommend signing up for the patient portal called MyChart.  Sign up information is provided on this After Visit Summary.  MyChart is used to connect with patients for Virtual Visits (Telemedicine).  Patients are able to view lab/test results, encounter notes, upcoming appointments, etc.  Non-urgent messages can be sent to your provider as well.   To learn more about what you can do with MyChart, go to [email or web address].    Other Instructions  Tips to Measure your Blood Pressure Correctly Here's what you can do to ensure a correct reading:  Don't drink a caffeinated beverage or smoke during the 30 minutes before the test.  Sit quietly for five minutes before the test begins.  During the measurement, sit in a chair with your feet on the floor and your arm supported so your elbow is at about heart level.  The inflatable part of the cuff should completely cover at least 80% of your upper arm, and the cuff should be placed on bare skin, not over a shirt.  Don't talk during the measurement.  Have your blood pressure measured twice, with a brief break in between. If the readings are different by 5 points or more, have it done a third time.  Blood pressure categories  Blood pressure category SYSTOLIC (upper number)  DIASTOLIC (lower number)  Normal Less than 120 mm Hg and Less than 80 mm Hg  Elevated 120-129 mm Hg and Less than 80 mm Hg  High blood pressure: Stage 1 hypertension 130-139 mm Hg or 80-89 mm Hg  High blood pressure: Stage 2 hypertension 140 mm Hg or higher or 90 mm Hg or higher  Hypertensive crisis (consult your doctor immediately) Higher than 180 mm Hg and/or Higher than 120 mm Hg  Source: American Heart Association and American Stroke Association. For more on getting your blood pressure under control, buy Controlling Your Blood Pressure, a Special  Health Report from [facility].   Blood Pressure Log   Date   Time  Blood Pressure  Position  Example: Nov 1 9 AM 124/78 sitting

## 2023-11-28 ENCOUNTER — Other Ambulatory Visit (HOSPITAL_COMMUNITY): Payer: Self-pay

## 2023-11-28 ENCOUNTER — Telehealth: Payer: Self-pay | Admitting: Pharmacy Technician

## 2023-11-28 NOTE — Telephone Encounter (Signed)
   Lf 11/27/23 brand at [facility] [employer] [facility]

## 2023-12-05 ENCOUNTER — Ambulatory Visit: Payer: Self-pay | Admitting: Emergency Medicine

## 2023-12-05 LAB — BASIC METABOLIC PANEL WITH GFR
BUN/Creatinine Ratio: 13 (ref 10–24)
BUN: 12 mg/dL (ref 8–27)
CO2: 24 mmol/L (ref 20–29)
Calcium: 9.1 mg/dL (ref 8.6–10.2)
Chloride: 99 mmol/L (ref 96–106)
Creatinine, Ser: 0.91 mg/dL (ref 0.76–1.27)
Glucose: 101 mg/dL — ABNORMAL HIGH (ref 70–99)
Potassium: 3.9 mmol/L (ref 3.5–5.2)
Sodium: 140 mmol/L (ref 134–144)
eGFR: 92 mL/min/1.73 (ref >59)

## 2023-12-05 LAB — LIPID PANEL
Chol/HDL Ratio: 3.1 ratio (ref 0.0–5.0)
Cholesterol, Total: 139 mg/dL (ref 100–199)
HDL: 45 mg/dL (ref >39)
LDL Chol Calc (NIH): 81 mg/dL (ref 0–99)
Triglycerides: 61 mg/dL (ref 0–149)
VLDL Cholesterol Cal: 13 mg/dL (ref 5–40)

## 2023-12-05 NOTE — Progress Notes (Signed)
 Patient has already been contacted regarding lab results.

## 2023-12-22 ENCOUNTER — Encounter: Payer: Self-pay | Admitting: Cardiology

## 2023-12-22 ENCOUNTER — Ambulatory Visit: Attending: Cardiology | Admitting: Cardiology

## 2023-12-22 VITALS — BP 140/90 | HR 91 | Ht 75.0 in | Wt 319.0 lb

## 2023-12-22 DIAGNOSIS — I2510 Atherosclerotic heart disease of native coronary artery without angina pectoris: Secondary | ICD-10-CM | POA: Diagnosis not present

## 2023-12-22 DIAGNOSIS — I5022 Chronic systolic (congestive) heart failure: Secondary | ICD-10-CM

## 2023-12-22 DIAGNOSIS — E7800 Pure hypercholesterolemia, unspecified: Secondary | ICD-10-CM

## 2023-12-22 DIAGNOSIS — I10 Essential (primary) hypertension: Secondary | ICD-10-CM | POA: Diagnosis not present

## 2023-12-22 DIAGNOSIS — I428 Other cardiomyopathies: Secondary | ICD-10-CM

## 2023-12-22 DIAGNOSIS — I77810 Thoracic aortic ectasia: Secondary | ICD-10-CM | POA: Diagnosis not present

## 2023-12-22 DIAGNOSIS — G4733 Obstructive sleep apnea (adult) (pediatric): Secondary | ICD-10-CM

## 2023-12-22 DIAGNOSIS — I2584 Coronary atherosclerosis due to calcified coronary lesion: Secondary | ICD-10-CM | POA: Diagnosis not present

## 2023-12-22 MED ORDER — SACUBITRIL-VALSARTAN 49-51 MG PO TABS: 1.0000 | ORAL_TABLET | Freq: Two times a day (BID) | ORAL | 0 refills | Status: AC

## 2023-12-22 NOTE — Patient Instructions (Signed)
 Medication Instructions:  INCREASE your Sacubitril -Valsartan  (Entresto ) from 24-26 mg to 49-51. Take one (1) tablet by mouth twice daily.  *If you need a refill on your cardiac medications before your next appointment, please call your pharmacy*  Lab Work: BMP, NT PRO BNP, in 1 Week If you have labs (blood work) drawn today and your tests are completely normal, you will receive your results only by: MyChart Message (if you have MyChart) OR A paper copy in the mail If you have any lab test that is abnormal or we need to change your treatment, we will call you to review the results.  Testing/Procedures: None ordered  Follow-Up: At [facility], you and your health needs are our priority.  As part of our continuing mission to provide you with exceptional heart care, our providers are all part of one team.  This team includes your primary Cardiologist (physician) and Advanced Practice Providers or APPs (Physician Assistants and Nurse Practitioners) who all work together to provide you with the care you need, when you need it.  Your next appointment:   6 month(s)  Provider:   Madonna Large, DO    We recommend signing up for the patient portal called MyChart.  Sign up information is provided on this After Visit Summary.  MyChart is used to connect with patients for Virtual Visits (Telemedicine).  Patients are able to view lab/test results, encounter notes, upcoming appointments, etc.  Non-urgent messages can be sent to your provider as well.   To learn more about what you can do with MyChart, go to [email or web address].

## 2023-12-22 NOTE — Progress Notes (Signed)
 Cardiology Office Note:  .   Date:  12/22/2023  ID:  Sean Norman, DOB [birth year], MRN [id number] PCP:  Elliot Charm, MD  Former Cardiology Providers: None [facility] Providers Cardiologist:  Madonna Large, DO, [facility] (established care 03/31/2021) Cardiology APP:  Vannie Reche RAMAN, NP  Electrophysiologist:  None  Click to update primary MD,subspecialty MD or APP then REFRESH:1}    Chief Complaint  Patient presents with   Follow-up    cardiomyopathy    History of Present Illness: .   Sean Norman is a 68 y.o.  male whose past medical history and cardiovascular risk factors includes: Nonischemic cardiomyopathy, mild CAC, hyperlipidemia, hypertension, family history of CAD/CAD, sleep apnea, not always compliant with CPAP, vitiligo, obesity  Patient was referred to the practice for hypertensive heart disease.  Echocardiogram noted reduced LVEF, dilated LV cavity, no significant valvular heart disease.  Ischemic workup included coronary CTA which noted nonobstructive CAD and mild CAC.  Patient's medications were uptitrated.    Patient was last seen in the office in February 2024.   Since in April 2023 his LVEF was 30-35% with HFrEF he was referred to EP for ICD implant consideration for primary prevention.  Patient did follow-up with Dr. Waddell who offered him ICD implant for primary prevention but patient was going to consider it and call Dr. Waddell back.  Patient was later seen by APP for preoperative risk assessment during which time repeat echocardiogram was performed which noted improvement in LVEF.  Patient did undergo right knee procedure in November 2025 without any postprocedural complications from a cardiovascular standpoint.  She he was also started on Entresto  24/26 mg p.o. twice daily.  In the past he was also on Farxiga  but for reasons unknown the medication was discontinued.  And patient does not want to restart it at this time.  Very well compensated from his  cardiomyopathy with improved LVEF. Reemphasized importance of medication titration and serial monitoring of LVEF. No significant weight gain or heart failure symptoms. No change in physical endurance, limited due to prior orthopedic comorbidities. Does use a CPAP but inconsistently. Patient states that he has chose not to proceed forward with the ICD even if he qualifies secondary to his hobbies.  Review of Systems: .   Review of Systems  Cardiovascular:  Negative for chest pain, claudication, irregular heartbeat, leg swelling, near-syncope, orthopnea, palpitations, paroxysmal nocturnal dyspnea and syncope.  Respiratory:  Negative for shortness of breath.   Hematologic/Lymphatic: Negative for bleeding problem.    Studies Reviewed:    Echocardiogram: 04/2021: LVEF 30-35%, grade 1 diastolic dysfunction, ascending aorta 39 mm 01/2022: LVEF 30-35%, grade 1 diastolic dysfunction  10/16/2023  1. Left ventricular ejection fraction, by estimation, is 40 to 45%. The  left ventricle has mildly decreased function. The left ventricle  demonstrates global hypokinesis. Left ventricular diastolic parameters are  consistent with Grade I diastolic  dysfunction (impaired relaxation).   2. Right ventricular systolic function is normal. The right ventricular  size is mildly enlarged.   3. The mitral valve is normal in structure. No evidence of mitral valve  regurgitation. No evidence of mitral stenosis.   4. The aortic valve is normal in structure. Aortic valve regurgitation is  not visualized. No aortic stenosis is present.   5. Aortic dilatation noted. There is borderline dilatation of the  ascending aorta, measuring 39 mm.   6. The inferior vena cava is normal in size with greater than 50%  respiratory variability, suggesting right atrial pressure of  3 mmHg.   Comparison(s): EF 30-35%, moderate global hypokinesis.   CCTA 08/03/2021: 1. Total coronary calcium  score of 61.6. This was 71st  percentile for age and sex matched control. 2. Normal coronary origin with left dominance. 3. CAD-RADS = 1. Left Main: Patent. LAD: Minimal plaque within proximal LAD otherwise patent. LCx: Patent RCA: Patent. 4. Stable small 3 mm left lower lobe pulmonary nodule which requires no follow-up. 5. No acute noncardiac cardiopulmonary finding.  RADIOLOGY: N/A  Risk Assessment/Calculations:   NA   Labs:       Latest Ref Rng & Units 10/19/2023   10:54 AM 09/09/2021    2:54 PM 05/02/2019    4:03 PM  CBC  WBC 4.0 - 10.5 K/uL 5.3  3.9  4.9   Hemoglobin 13.0 - 17.0 g/dL 86.0  85.1  85.7   Hematocrit 39.0 - 52.0 % 43.0  46.0  42.4   Platelets 150 - 400 K/uL 239  233  259.0        Latest Ref Rng & Units 12/04/2023    9:14 AM 10/19/2023   10:54 AM 02/23/2022    1:09 PM  BMP  Glucose 70 - 99 mg/dL 898  94  91   BUN 8 - 27 mg/dL 12  16  17    Creatinine 0.76 - 1.27 mg/dL 9.08  9.02  8.81   BUN/Creat Ratio 10 - 24 13   14    Sodium 134 - 144 mmol/L 140  138  144   Potassium 3.5 - 5.2 mmol/L 3.9  3.7  4.4   Chloride 96 - 106 mmol/L 99  101  101   CO2 20 - 29 mmol/L 24  28  27    Calcium  8.6 - 10.2 mg/dL 9.1  9.6  89.9       Latest Ref Rng & Units 12/04/2023    9:14 AM 10/19/2023   10:54 AM 02/23/2022    1:09 PM  CMP  Glucose 70 - 99 mg/dL 898  94  91   BUN 8 - 27 mg/dL 12  16  17    Creatinine 0.76 - 1.27 mg/dL 9.08  9.02  8.81   Sodium 134 - 144 mmol/L 140  138  144   Potassium 3.5 - 5.2 mmol/L 3.9  3.7  4.4   Chloride 96 - 106 mmol/L 99  101  101   CO2 20 - 29 mmol/L 24  28  27    Calcium  8.6 - 10.2 mg/dL 9.1  9.6  89.9     Lab Results  Component Value Date   CHOL 139 12/04/2023   HDL 45 12/04/2023   LDLCALC 81 12/04/2023   LDLDIRECT 71 08/19/2021   TRIG 61 12/04/2023   CHOLHDL 3.1 12/04/2023   No results for input(s): LIPOA in the last 8760 hours. No components found for: NTPROBNP No results for input(s): PROBNP in the last 8760 hours. No results for input(s):  TSH in the last 8760 hours.  Physical Exam:    Today's Vitals   12/22/23 1558  BP: (!) 140/90  Pulse: 91  SpO2: 92%  Weight: (!) 319 lb (144.7 kg)  Height: 6' 3 (1.905 m)   Body mass index is 39.87 kg/m. Wt Readings from Last 3 Encounters:  12/22/23 (!) 319 lb (144.7 kg)  11/27/23 (!) 320 lb 14.4 oz (145.6 kg)  11/08/23 (!) 317 lb (143.8 kg)    Physical Exam  Constitutional: No distress.  hemodynamically stable, ambulates with a cane  Neck: No JVD present.  Cardiovascular: Normal rate, regular rhythm, S1 normal and S2 normal. Exam reveals no gallop, no S3 and no S4.  No murmur heard. Pulmonary/Chest: Effort normal and breath sounds normal. No stridor. He has no wheezes. He has no rales.  Musculoskeletal:        General: No edema.     Cervical back: Neck supple.  Skin: Skin is warm.  Vitiligo    Impression & Recommendation(s):  Impression:   ICD-10-CM   1. Nonischemic cardiomyopathy (HCC)  I42.8 sacubitril -valsartan  (ENTRESTO ) 49-51 MG    Basic Metabolic Panel (BMET)    B Nat Peptide    2. Chronic heart failure with mildly reduced ejection fraction (HFmrEF) (HCC)  I50.22 sacubitril -valsartan  (ENTRESTO ) 49-51 MG    Basic Metabolic Panel (BMET)    B Nat Peptide    3. Coronary atherosclerosis due to calcified coronary lesion  I25.10    I25.84     4. Benign hypertension  I10     5. Pure hypercholesterolemia  E78.00     6. Mild ascending aorta dilatation  I77.810     7. OSA (obstructive sleep apnea)  G47.33        Recommendation(s):  Nonischemic cardiomyopathy (HCC) Chronic heart failure with mildly reduced ejection fraction (HFmrEF) (HCC) Historically LVEF has been 30-35% Most recent echo October 2025: LVEF 40-45% Stage B, NYHA class II Continue spironolactone  25 mg p.o. every morning. Continue Toprol -XL 50 mg p.o. every morning. Continue hydrochlorothiazide  25 mg p.o. every morning. Increase Entresto  24/26 mg p.o. twice daily to Entresto  49/51 mg p.o.  twice daily. BMP in one week to check renal function and electrolytes. Recommended transitioning from hydrochlorothiazide  to Farxiga , patient would like to hold off on addition of Farxiga  at this time.  To his knowledge he does not recall why the medication was discontinued.  He denies any prior history of urinary tract infections.  Coronary atherosclerosis due to calcified coronary lesion Mild CAC as of July 2023. No anginal chest pain. Continue aspirin  and statin therapy. Emphasized importance of improving modifiable cardiovascular risk factors.  Benign hypertension Office blood pressures are not at goal.   Home blood pressures seem to be better controlled but no log available for review. Medication changes as mentioned above  Pure hypercholesterolemia Index LDL 171 mg/dL. Currently on Lipitor 40 mg p.o. nightly. LDL better controlled as of November 2025, results independently reviewed and mentioned above.  Mild ascending aorta dilatation Chronic and stable. Most recent echocardiogram notes ascending aorta at 39 mm Reemphasize importance of blood pressure management. Currently on ARB as well as Toprol -XL. Patient is aware of not being on long-term use of fluoroquinolone antibiotics Patient is also aware of symptoms consistent with aortic syndromes and if present should go to the closest ER via EMS Recommended goal SBP <130 mmHg  OSA (obstructive sleep apnea) Uses CPAP inconsistently. Educated him regarding pathophysiology of obstructive sleep apnea and encouraged to use of CPAP regularly.   Orders Placed:  Orders Placed This Encounter  Procedures   Basic Metabolic Panel (BMET)   B Nat Peptide     Final Medication List:    Meds ordered this encounter  Medications   sacubitril -valsartan  (ENTRESTO ) 49-51 MG    Sig: Take 1 tablet by mouth 2 (two) times daily.    Dispense:  60 tablet    Refill:  0    Medications Discontinued During This Encounter  Medication Reason    sacubitril -valsartan  (ENTRESTO ) 24-26 MG Patient Preference   sacubitril -valsartan  (ENTRESTO ) 24-26 MG Dose change  Current Medications[1]  Consent:   NA  Disposition:   53-month follow-up sooner if needed  His questions and concerns were addressed to his satisfaction. He voices understanding of the recommendations provided during this encounter.   Medical decision making: Discussed management of at least 2 chronic comorbid conditions. Reviewed prior records relevant to cardiovascular care from Dr. Waddell April 2024 and APP progress notes. Prescription drug management. Uptitration of medical therapy. Patient education. Coordination of care. Follow-up labs ordered Outside labs independently reviewed from November 2025 Echocardiogram reviewed from October 2025  Signed, Madonna Large, DO, [facility]  [facility]  A Division of  [facility] [address], [place], [place] [address]  12/22/2023 6:15 PM     [1]  Current Outpatient Medications:    aspirin  EC 81 MG tablet, Take 1 tablet (81 mg total) by mouth daily. Swallow whole., Disp: 30 tablet, Rfl: 12   atorvastatin  (LIPITOR) 40 MG tablet, Take 1 tablet (40 mg total) by mouth at bedtime., Disp: 90 tablet, Rfl: 0   docusate sodium  (COLACE) 100 MG capsule, Take 1 capsule (100 mg total) by mouth 2 (two) times daily., Disp: 60 capsule, Rfl: 2   hydrochlorothiazide  (HYDRODIURIL ) 25 MG tablet, Take 25 mg by mouth every morning., Disp: , Rfl:    ibuprofen  (ADVIL ) 800 MG tablet, Take 1 tablet (800 mg total) by mouth 3 (three) times daily with meals as needed., Disp: 90 tablet, Rfl: 1   methocarbamol  (ROBAXIN ) 500 MG tablet, Take 1 tablet (500 mg total) by mouth every 8 (eight) hours as needed for muscle spasms., Disp: 30 tablet, Rfl: 1   metoprolol  succinate (TOPROL -XL) 50 MG 24 hr tablet, TAKE 1 TABLET(50 MG) BY MOUTH EVERY MORNING, Disp: 30 tablet, Rfl: 03   NON FORMULARY, Pt uses a cpap nightly, Disp: , Rfl:     sacubitril -valsartan  (ENTRESTO ) 49-51 MG, Take 1 tablet by mouth 2 (two) times daily., Disp: 60 tablet, Rfl: 0   spironolactone  (ALDACTONE ) 25 MG tablet, TAKE 1 TABLET(25 MG) BY MOUTH EVERY MORNING, Disp: 30 tablet, Rfl: 3

## 2024-01-03 ENCOUNTER — Other Ambulatory Visit (HOSPITAL_COMMUNITY): Payer: Self-pay

## 2024-01-03 ENCOUNTER — Telehealth: Payer: Self-pay

## 2024-01-03 NOTE — Telephone Encounter (Signed)
 Retail is constantly asking for generic Entresto  PA without first trying brand NDC (no DAW code or restrictions noted on RX). CMM request received from pharmacy for Entresto  generic. Plan prefers brand and PA is not needed, brand is covered. Second time we have provided billing instructions to [facility]. If this continues to be a barrier in care, patient may need to consider another pharmacy.

## 2024-01-03 NOTE — Telephone Encounter (Signed)
 Just an FYI.
